# Patient Record
Sex: Male | Born: 1995 | Race: White | Hispanic: No | Marital: Single | Smoking: Current every day smoker
Health system: Southern US, Community
[De-identification: ages and names within clinical notes are randomized; demographics above are authoritative.]

## PROBLEM LIST (undated history)

## (undated) DIAGNOSIS — J45909 Unspecified asthma, uncomplicated: Secondary | ICD-10-CM

---
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
## 2015-07-05 ENCOUNTER — Emergency Department
Admission: EM | Admit: 2015-07-05 | Discharge: 2015-07-05 | Disposition: A | Discharge status: Home / Self Care | Payer: Medicaid Other | Attending: Emergency Medicine | Admitting: Emergency Medicine

## 2015-07-05 ENCOUNTER — Emergency Department: Payer: Medicaid Other

## 2015-07-05 ENCOUNTER — Encounter: Payer: Self-pay | Admitting: Urgent Care

## 2015-07-05 DIAGNOSIS — J988 Other specified respiratory disorders: Secondary | ICD-10-CM | POA: Diagnosis not present

## 2015-07-05 DIAGNOSIS — F17200 Nicotine dependence, unspecified, uncomplicated: Secondary | ICD-10-CM | POA: Insufficient documentation

## 2015-07-05 DIAGNOSIS — Z720 Tobacco use: Secondary | ICD-10-CM

## 2015-07-05 DIAGNOSIS — J45901 Unspecified asthma with (acute) exacerbation: Secondary | ICD-10-CM | POA: Diagnosis not present

## 2015-07-05 DIAGNOSIS — R05 Cough: Secondary | ICD-10-CM | POA: Diagnosis present

## 2015-07-05 HISTORY — DX: Unspecified asthma, uncomplicated: J45.909

## 2015-07-05 LAB — RAPID INFLUENZA A&B ANTIGENS
Influenza A (ARMC): NOT DETECTED
Influenza B (ARMC): NOT DETECTED

## 2015-07-05 MED ORDER — ALBUTEROL SULFATE (2.5 MG/3ML) 0.083% IN NEBU: 2.5000 mg | INHALATION_SOLUTION | Freq: Four times a day (QID) | RESPIRATORY_TRACT | Status: AC | PRN

## 2015-07-05 MED ORDER — ALBUTEROL SULFATE HFA 108 (90 BASE) MCG/ACT IN AERS: INHALATION_SPRAY | RESPIRATORY_TRACT | Status: AC

## 2015-07-05 MED ORDER — PREDNISONE 20 MG PO TABS: 60.0000 mg | ORAL_TABLET | Freq: Every day | ORAL | Status: DC

## 2015-07-05 NOTE — ED Notes (Signed)
Patient presents with c/o cough for over a month. Patient reports that he got better following a course of ABX and steroids, however symptoms returned. (+) pain with deep inspiration. (+) post tussive vomiting.

## 2015-07-05 NOTE — Discharge Instructions (Signed)
We believe that your symptoms are caused today by an exacerbation of your asthma.  Please take the prescribed medications and any medications that you have at home.  Follow up with your doctor as recommended.  If you develop any new or worsening symptoms, including but not limited to fever, persistent vomiting, worsening shortness of breath, or other symptoms that concern you, please return to the Emergency Department immediately.  It is also important that you stop smoking; tobacco is only going to worsen your symptoms and cause more chronic lung disease.    Asthma Asthma is a recurring condition in which the airways tighten and narrow. Asthma can make it difficult to breathe. It can cause coughing, wheezing, and shortness of breath. Asthma episodes, also called asthma attacks, range from minor to life-threatening. Asthma cannot be cured, but medicines and lifestyle changes can help control it. CAUSES Asthma is believed to be caused by inherited (genetic) and environmental factors, but its exact cause is unknown. Asthma may be triggered by allergens, lung infections, or irritants in the air. Asthma triggers are different for each person. Common triggers include:  1. Animal dander. 2. Dust mites. 3. Cockroaches. 4. Pollen from trees or grass. 5. Mold. 6. Smoke. 7. Air pollutants such as dust, household cleaners, hair sprays, aerosol sprays, paint fumes, strong chemicals, or strong odors. 8. Cold air, weather changes, and winds (which increase molds and pollens in the air). 9. Strong emotional expressions such as crying or laughing hard. 10. Stress. 11. Certain medicines (such as aspirin) or types of drugs (such as beta-blockers). 12. Sulfites in foods and drinks. Foods and drinks that may contain sulfites include dried fruit, potato chips, and sparkling grape juice. 13. Infections or inflammatory conditions such as the flu, a cold, or an inflammation of the nasal membranes  (rhinitis). 14. Gastroesophageal reflux disease (GERD). 15. Exercise or strenuous activity. SYMPTOMS Symptoms may occur immediately after asthma is triggered or many hours later. Symptoms include: 1. Wheezing. 2. Excessive nighttime or early morning coughing. 3. Frequent or severe coughing with a common cold. 4. Chest tightness. 5. Shortness of breath. DIAGNOSIS  The diagnosis of asthma is made by a review of your medical history and a physical exam. Tests may also be performed. These may include:  Lung function studies. These tests show how much air you breathe in and out.  Allergy tests.  Imaging tests such as X-rays. TREATMENT  Asthma cannot be cured, but it can usually be controlled. Treatment involves identifying and avoiding your asthma triggers. It also involves medicines. There are 2 classes of medicine used for asthma treatment:   Controller medicines. These prevent asthma symptoms from occurring. They are usually taken every day.  Reliever or rescue medicines. These quickly relieve asthma symptoms. They are used as needed and provide short-term relief. Your health care provider will help you create an asthma action plan. An asthma action plan is a written plan for managing and treating your asthma attacks. It includes a list of your asthma triggers and how they may be avoided. It also includes information on when medicines should be taken and when their dosage should be changed. An action plan may also involve the use of a device called a peak flow meter. A peak flow meter measures how well the lungs are working. It helps you monitor your condition. HOME CARE INSTRUCTIONS   Take medicines only as directed by your health care provider. Speak with your health care provider if you have questions about how or  when to take the medicines.  Use a peak flow meter as directed by your health care provider. Record and keep track of readings.  Understand and use the action plan to help  minimize or stop an asthma attack without needing to seek medical care.  Control your home environment in the following ways to help prevent asthma attacks:  Do not smoke. Avoid being exposed to secondhand smoke.  Change your heating and air conditioning filter regularly.  Limit your use of fireplaces and wood stoves.  Get rid of pests (such as roaches and mice) and their droppings.  Throw away plants if you see mold on them.  Clean your floors and dust regularly. Use unscented cleaning products.  Try to have someone else vacuum for you regularly. Stay out of rooms while they are being vacuumed and for a short while afterward. If you vacuum, use a dust mask from a hardware store, a double-layered or microfilter vacuum cleaner bag, or a vacuum cleaner with a HEPA filter.  Replace carpet with wood, tile, or vinyl flooring. Carpet can trap dander and dust.  Use allergy-proof pillows, mattress covers, and box spring covers.  Wash bed sheets and blankets every week in hot water and dry them in a dryer.  Use blankets that are made of polyester or cotton.  Clean bathrooms and kitchens with bleach. If possible, have someone repaint the walls in these rooms with mold-resistant paint. Keep out of the rooms that are being cleaned and painted.  Wash hands frequently. SEEK MEDICAL CARE IF:   You have wheezing, shortness of breath, or a cough even if taking medicine to prevent attacks.  The colored mucus you cough up (sputum) is thicker than usual.  Your sputum changes from clear or white to yellow, green, gray, or bloody.  You have any problems that may be related to the medicines you are taking (such as a rash, itching, swelling, or trouble breathing).  You are using a reliever medicine more than 2-3 times per week.  Your peak flow is still at 50-79% of your personal best after following your action plan for 1 hour.  You have a fever. SEEK IMMEDIATE MEDICAL CARE IF:   You seem to be  getting worse and are unresponsive to treatment during an asthma attack.  You are short of breath even at rest.  You get short of breath when doing very little physical activity.  You have difficulty eating, drinking, or talking due to asthma symptoms.  You develop chest pain.  You develop a fast heartbeat.  You have a bluish color to your lips or fingernails.  You are light-headed, dizzy, or faint.  Your peak flow is less than 50% of your personal best. MAKE SURE YOU:   Understand these instructions.  Will watch your condition.  Will get help right away if you are not doing well or get worse. Document Released: 06/02/2005 Document Revised: 10/17/2013 Document Reviewed: 12/30/2012 [facility] Patient Information 2015 [place], [place]. This information is not intended to replace advice given to you by your health care provider. Make sure you discuss any questions you have with your health care provider.  How to Use a Nebulizer If you have asthma or other breathing problems, you might need to breathe in (inhale) medicine. This can be done with a nebulizer. A nebulizer is a device that turns liquid medicine into a mist that you can inhale.  There are different kinds of nebulizers. Most are small. With some, you breathe in through  a mouthpiece. With others, a mask fits over your nose and mouth. Most nebulizers must be connected to a small air compressor. Air is forced through tubing from the compressor to the nebulizer. The forced air changes the liquid into a fine spray. RISKS AND COMPLICATIONS The nebulizer must work properly for it to help your breathing. If the nebulizer does not produce mist, or if foam comes out, this indicates that the nebulizer is not working properly. Sometimes a filter can get clogged, or there might be a problem with the air compressor. Check the instruction booklet that came with your nebulizer. It should tell you how to fix problems or where to call for help. You  should have at least one extra nebulizer at home. That way, you will always have one when you need it.  HOW TO PREPARE BEFORE USING THE NEBULIZER Take these steps before using the nebulizer: 16. Check your medicine. Make sure it has not expired and is not damaged in any way.  17. Wash your hands with soap and water.  18. Put all the parts of your nebulizer on a sturdy, flat surface. Make sure the tubing connects the compressor and the nebulizer. 19. Measure the liquid medicine according to your health care provider's instructions. Pour it into the nebulizer. 20. Attach the mouthpiece or mask.  21. Test the nebulizer by turning it on to make sure a spray is coming out. Then, turn it off.  HOW TO USE THE NEBULIZER 6. Sit down and focus on staying relaxed.  7. If your nebulizer has a mask, put it over your nose and mouth. If you use a mouthpiece, put it in your mouth. Press your lips firmly around the mouthpiece. 8. Turn on the nebulizer.  9. Breathe out.  10. Some nebulizers have a finger valve. If yours does, cover up the air hole so the air gets to the nebulizer. 11. Once the medicine begins to mist out, take slow, deep breaths. If there is a finger valve, release it at the end of your breath. 12. Continue taking slow, deep breaths until the nebulizer is empty.  Be sure to stop the machine at any point if you start coughing or if the medicine foams or bubbles. HOW TO CLEAN THE NEBULIZER  The nebulizer and all its parts must be kept very clean. Follow the manufacturer's instructions for cleaning. For most nebulizers, you should follow these guidelines:  Wash the nebulizer after each use. Use warm water and soap. Rinse it well. Shake the nebulizer to remove extra water. Put it on a clean towel until it is completely dry. To make sure it is dry, put the nebulizer back together. Turn on the compressor for a few minutes. This will blow air through the nebulizer.   Do not wash the tubing or  the finger valve.   Store the nebulizer in a dust-free place.   Inspect the filter every week. Replace it any time it looks dirty.   Sometimes the nebulizer will need a more complete cleaning. The instruction booklet should say how often you need to do this. SEEK MEDICAL CARE IF:   You continue to have difficulty breathing.   You have trouble using the nebulizer.  Document Released: 05/21/2009 Document Revised: 10/17/2013 Document Reviewed: 11/22/2012 [facility] Patient Information 2015 [place], [place]. This information is not intended to replace advice given to you by your health care provider. Make sure you discuss any questions you have with your health care provider.  How to  Use an Inhaler Proper inhaler technique is very important. Good technique ensures that the medicine reaches the lungs. Poor technique results in depositing the medicine on the tongue and back of the throat rather than in the airways. If you do not use the inhaler with good technique, the medicine will not help you. STEPS TO FOLLOW IF USING AN INHALER WITHOUT AN EXTENSION TUBE 22. Remove the cap from the inhaler. 23. If you are using the inhaler for the first time, you will need to prime it. Shake the inhaler for 5 seconds and release four puffs into the air, away from your face. Ask your health care provider or pharmacist if you have questions about priming your inhaler. 24. Shake the inhaler for 5 seconds before each breath in (inhalation). 25. Position the inhaler so that the top of the canister faces up. 26. Put your index finger on the top of the medicine canister. Your thumb supports the bottom of the inhaler. 27. Open your mouth. 28. Either place the inhaler between your teeth and place your lips tightly around the mouthpiece, or hold the inhaler 1-2 inches away from your open mouth. If you are unsure of which technique to use, ask your health care provider. 29. Breathe out (exhale) normally and as  completely as possible. 30. Press the canister down with your index finger to release the medicine. 31. At the same time as the canister is pressed, inhale deeply and slowly until your lungs are completely filled. This should take 4-6 seconds. Keep your tongue down. 32. Hold the medicine in your lungs for 5-10 seconds (10 seconds is best). This helps the medicine get into the small airways of your lungs. 33. Breathe out slowly, through pursed lips. Whistling is an example of pursed lips. 34. Wait at least 15-30 seconds between puffs. Continue with the above steps until you have taken the number of puffs your health care provider has ordered. Do not use the inhaler more than your health care provider tells you. 35. Replace the cap on the inhaler. 36. Follow the directions from your health care provider or the inhaler insert for cleaning the inhaler. STEPS TO FOLLOW IF USING AN INHALER WITH AN EXTENSION (SPACER) 13. Remove the cap from the inhaler. 14. If you are using the inhaler for the first time, you will need to prime it. Shake the inhaler for 5 seconds and release four puffs into the air, away from your face. Ask your health care provider or pharmacist if you have questions about priming your inhaler. 15. Shake the inhaler for 5 seconds before each breath in (inhalation). 16. Place the open end of the spacer onto the mouthpiece of the inhaler. 17. Position the inhaler so that the top of the canister faces up and the spacer mouthpiece faces you. 18. Put your index finger on the top of the medicine canister. Your thumb supports the bottom of the inhaler and the spacer. 19. Breathe out (exhale) normally and as completely as possible. 20. Immediately after exhaling, place the spacer between your teeth and into your mouth. Close your lips tightly around the spacer. 21. Press the canister down with your index finger to release the medicine. 22. At the same time as the canister is pressed, inhale  deeply and slowly until your lungs are completely filled. This should take 4-6 seconds. Keep your tongue down and out of the way. 23. Hold the medicine in your lungs for 5-10 seconds (10 seconds is best). This helps the medicine  get into the small airways of your lungs. Exhale. 24. Repeat inhaling deeply through the spacer mouthpiece. Again hold that breath for up to 10 seconds (10 seconds is best). Exhale slowly. If it is difficult to take this second deep breath through the spacer, breathe normally several times through the spacer. Remove the spacer from your mouth. 25. Wait at least 15-30 seconds between puffs. Continue with the above steps until you have taken the number of puffs your health care provider has ordered. Do not use the inhaler more than your health care provider tells you. 26. Remove the spacer from the inhaler, and place the cap on the inhaler. 27. Follow the directions from your health care provider or the inhaler insert for cleaning the inhaler and spacer. If you are using different kinds of inhalers, use your quick relief medicine to open the airways 10-15 minutes before using a steroid if instructed to do so by your health care provider. If you are unsure which inhalers to use and the order of using them, ask your health care provider, nurse, or respiratory therapist. If you are using a steroid inhaler, always rinse your mouth with water after your last puff, then gargle and spit out the water. Do not swallow the water. AVOID:  Inhaling before or after starting the spray of medicine. It takes practice to coordinate your breathing with triggering the spray.  Inhaling through the nose (rather than the mouth) when triggering the spray. HOW TO DETERMINE IF YOUR INHALER IS FULL OR NEARLY EMPTY You cannot know when an inhaler is empty by shaking it. A few inhalers are now being made with dose counters. Ask your health care provider for a prescription that has a dose counter if you feel  you need that extra help. If your inhaler does not have a counter, ask your health care provider to help you determine the date you need to refill your inhaler. Write the refill date on a calendar or your inhaler canister. Refill your inhaler 7-10 days before it runs out. Be sure to keep an adequate supply of medicine. This includes making sure it is not expired, and that you have a spare inhaler.  SEEK MEDICAL CARE IF:   Your symptoms are only partially relieved with your inhaler.  You are having trouble using your inhaler.  You have some increase in phlegm. SEEK IMMEDIATE MEDICAL CARE IF:   You feel little or no relief with your inhalers. You are still wheezing and are feeling shortness of breath or tightness in your chest or both.  You have dizziness, headaches, or a fast heart rate.  You have chills, fever, or night sweats.  You have a noticeable increase in phlegm production, or there is blood in the phlegm. MAKE SURE YOU:   Understand these instructions.  Will watch your condition.  Will get help right away if you are not doing well or get worse. Document Released: 05/30/2000 Document Revised: 03/23/2013 Document Reviewed: 12/30/2012 [facility] Patient Information 2015 [place], [place]. This information is not intended to replace advice given to you by your health care provider. Make sure you discuss any questions you have with your health care provider.

## 2015-07-05 NOTE — ED Provider Notes (Signed)
[facility] Emergency Department Provider Note  ____________________________________________  Time seen: Approximately 5:49 AM  I have reviewed the triage vital signs and the nursing notes.   HISTORY  Chief Complaint Cough    HPI Frederick Griffith is a 20 y.o. male with a past medical history that includes asthma and tobacco use who presents with gradually worsening shortness of breath and cough for about a month.  He states that it has gotten worse over the last few days.  Exertion makes it worse.  Using albuterol through a nebulizer seems to make it better.  He denies fever/chills, chest pain, abdominal pain, diarrhea.  He has some vomiting after coughing at times.  He has noted some wheezing.     Past Medical History  Diagnosis Date  . Asthma     There are no active problems to display for this patient.   History reviewed. No pertinent past surgical history.  Current Outpatient Rx  Name  Route  Sig  Dispense  Refill  . albuterol (PROVENTIL HFA;VENTOLIN HFA) 108 (90 Base) MCG/ACT inhaler      Inhale 4-6 puffs by mouth every 4 hours as needed for wheezing, cough, and/or shortness of breath   1 Inhaler   1   . albuterol (PROVENTIL) (2.5 MG/3ML) 0.083% nebulizer solution   Nebulization   Take 3 mLs (2.5 mg total) by nebulization every 6 (six) hours as needed for wheezing or shortness of breath.   75 mL   1   . predniSONE (DELTASONE) 20 MG tablet   Oral   Take 3 tablets (60 mg total) by mouth daily.   15 tablet   0     Allergies Codeine; Doxycycline; and Shellfish allergy  No family history on file.  Social History Social History  Substance Use Topics  . Smoking status: Current Every Day Smoker  . Smokeless tobacco: None  . Alcohol Use: No    Review of Systems Constitutional: No fever/chills Eyes: No visual changes. ENT: No sore throat. Cardiovascular: Denies chest pain. Respiratory: Wheezing and SOB, worsening for about a week  but present for about a month Gastrointestinal: No abdominal pain.  Occasional N/V, particularly after coughing spells.  No diarrhea.  No constipation. Genitourinary: Negative for dysuria. Musculoskeletal: Negative for back pain. Skin: Negative for rash. Neurological: Negative for headaches, focal weakness or numbness.  10-point ROS otherwise negative.  ____________________________________________   PHYSICAL EXAM:  VITAL SIGNS: ED Triage Vitals  Enc Vitals Group     BP 07/05/15 0130 148/89 mmHg     Pulse Rate 07/05/15 0130 107     Resp 07/05/15 0130 24     Temp 07/05/15 0130 98.5 F (36.9 C)     Temp Source 07/05/15 0130 Oral     SpO2 07/05/15 0130 99 %     Weight 07/05/15 0130 210 lb (95.255 kg)     Height 07/05/15 0130  (1.676 m)     Head Cir --      Peak Flow --      Pain Score 07/05/15 0131 9     Pain Loc --      Pain Edu? --      Excl. in GC? --     Constitutional: Alert and oriented. Well appearing and in no acute distress. Eyes: Conjunctivae are normal. PERRL. EOMI. Head: Atraumatic. Nose: No congestion/rhinnorhea. Mouth/Throat: Mucous membranes are moist.  Oropharynx non-erythematous. Neck: No stridor.   Cardiovascular: Normal rate, regular rhythm. Grossly normal heart sounds.  Good peripheral circulation. Respiratory: Normal respiratory effort.  No retractions. Expiratory wheezing throughout (mild) Gastrointestinal: Soft and nontender. No distention. No abdominal bruits. No CVA tenderness. Musculoskeletal: No lower extremity tenderness nor edema.  No joint effusions. Neurologic:  Normal speech and language. No gross focal neurologic deficits are appreciated.  Skin:  Skin is warm, dry and intact. No rash noted. Psychiatric: Mood and affect are normal. Speech and behavior are normal.  ____________________________________________   LABS (all labs ordered are listed, but only abnormal results are displayed)  Labs Reviewed  RAPID INFLUENZA A&B ANTIGENS  [facility] ONLY)   ____________________________________________  EKG  None ____________________________________________  RADIOLOGY   Dg Chest 2 View  07/05/2015  CLINICAL DATA:  Chronic cough. Generalized chest pain with deep inspiration. Vomiting. Initial encounter. EXAM: CHEST  2 VIEW COMPARISON:  None. FINDINGS: The lungs are well-aerated and clear. There is no evidence of focal opacification, pleural effusion or pneumothorax. The heart is normal in size; the mediastinal contour is within normal limits. No acute osseous abnormalities are seen. IMPRESSION: No acute cardiopulmonary process seen. Electronically Signed   By: Roanna Raider M.D.   On: 07/05/2015 02:09    ____________________________________________   PROCEDURES  Procedure(s) performed: None  Critical Care performed: No ____________________________________________   INITIAL IMPRESSION / ASSESSMENT AND PLAN / ED COURSE  Pertinent labs & imaging results that were available during my care of the patient were reviewed by me and considered in my medical decision making (see chart for details).  Asthma exacerbation +/- viral infection.  No evidence of PNA.  Will treat as asthma exacerbation.    I gave my usual and customary return precautions.   Patient agrees with plan.  ____________________________________________  FINAL CLINICAL IMPRESSION(S) / ED DIAGNOSES  Final diagnoses:  Asthma exacerbation  Wheezing-associated respiratory infection  Tobacco use      NEW MEDICATIONS STARTED DURING THIS VISIT:  New Prescriptions   ALBUTEROL (PROVENTIL HFA;VENTOLIN HFA) 108 (90 BASE) MCG/ACT INHALER    Inhale 4-6 puffs by mouth every 4 hours as needed for wheezing, cough, and/or shortness of breath   ALBUTEROL (PROVENTIL) (2.5 MG/3ML) 0.083% NEBULIZER SOLUTION    Take 3 mLs (2.5 mg total) by nebulization every 6 (six) hours as needed for wheezing or shortness of breath.   PREDNISONE (DELTASONE) 20 MG TABLET    Take 3  tablets (60 mg total) by mouth daily.     Loleta Rose, MD 07/05/15 [phone]

## 2015-07-05 NOTE — ED Notes (Signed)
Spoke with Manson Passey, MD regarding presenting c/o and triage assessment. MD with [facility] for 2 view CXR and rapid flu only at this time. Orders to be entered and carried by this RN.

## 2015-07-31 ENCOUNTER — Ambulatory Visit
Admission: EM | Admit: 2015-07-31 | Discharge: 2015-07-31 | Disposition: A | Discharge status: Home / Self Care | Payer: Medicaid Other | Attending: Family Medicine | Admitting: Family Medicine

## 2015-07-31 ENCOUNTER — Encounter: Payer: Self-pay | Admitting: Emergency Medicine

## 2015-07-31 DIAGNOSIS — T7840XA Allergy, unspecified, initial encounter: Secondary | ICD-10-CM

## 2015-07-31 MED ORDER — DIPHENHYDRAMINE HCL 50 MG/ML IJ SOLN
50.0000 mg | Freq: Once | INTRAMUSCULAR | Status: AC
  Administered 2015-07-31: 50 mg via INTRAMUSCULAR

## 2015-07-31 MED ORDER — PREDNISONE 50 MG PO TABS
60.0000 mg | ORAL_TABLET | Freq: Once | ORAL | Status: AC
  Administered 2015-07-31: 60 mg via ORAL

## 2015-07-31 MED ORDER — EPINEPHRINE 0.3 MG/0.3ML IJ SOAJ: 0.3000 mg | Freq: Once | INTRAMUSCULAR | Status: AC

## 2015-07-31 MED ORDER — PREDNISONE 20 MG PO TABS: 20.0000 mg | ORAL_TABLET | Freq: Every day | ORAL | Status: DC

## 2015-07-31 NOTE — ED Notes (Signed)
Patient states that he works at a [employer] and was shelling shrimp yesterday.  Patient c/o itching, rash and swelling in his hands that started around 9:30pm last night.  Patient took Benadryl at 10:30pm last night.  Patient states that he has an allergy to Shellfish.

## 2015-07-31 NOTE — ED Provider Notes (Signed)
CSN: [id number]     Arrival date & time 07/31/15  1251 History   First MD Initiated Contact with Patient 07/31/15 1402     Chief Complaint  Patient presents with  . Allergic Reaction   (Consider location/radiation/quality/duration/timing/severity/associated sxs/prior Treatment) HPI Comments: 20 yo male with a c/o itchy rash, allergic reaction to shrimp since last night. 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 Last night developed rash, swelling and itching of his hands. Last night took Benadryl at around 10:30pm. Denies any wheezing, chest pains, shortness of breath, or facial swelling.   The history is provided by the patient.    Past Medical History  Diagnosis Date  . Asthma    History reviewed. No pertinent past surgical history. History reviewed. No pertinent family history. Social History  Substance Use Topics  . Smoking status: Current Every Day Smoker  . Smokeless tobacco: None  . Alcohol Use: No    Review of Systems  Allergies  Codeine; Doxycycline; and Shellfish allergy  Home Medications   Prior to Admission medications   Medication Sig Start Date End Date Taking? Authorizing Provider  atomoxetine (STRATTERA) 25 MG capsule Take 25 mg by mouth daily.   Yes Historical Provider, MD  omeprazole (PRILOSEC) 20 MG capsule Take 20 mg by mouth as needed.   Yes Historical Provider, MD  albuterol (PROVENTIL HFA;VENTOLIN HFA) 108 (90 Base) MCG/ACT inhaler Inhale 4-6 puffs by mouth every 4 hours as needed for wheezing, cough, and/or shortness of breath 07/05/15   Loleta Rose, MD  albuterol (PROVENTIL) (2.5 MG/3ML) 0.083% nebulizer solution Take 3 mLs (2.5 mg total) by nebulization every 6 (six) hours as needed for wheezing or shortness of breath. 07/05/15   Loleta Rose, MD  EPINEPHrine 0.3 mg/0.3 mL IJ SOAJ injection Inject 0.3 mLs (0.3 mg total) into the muscle once. prn 07/31/15   Payton Mccallum, MD  predniSONE (DELTASONE) 20 MG tablet Take 1 tablet (20 mg total) by mouth daily. 07/31/15   Payton Mccallum, MD   Meds Ordered and Administered this Visit   Medications  diphenhydrAMINE (BENADRYL) injection 50 mg (50 mg Intramuscular Given 07/31/15 1323)  predniSONE (DELTASONE) tablet 60 mg (60 mg Oral Given 07/31/15 1416)    BP 129/70 mmHg  Pulse 92  Temp(Src) 98.6 F (37 C) (Tympanic)  Resp 17  Ht  (1.676 m)  Wt 210 lb (95.255 kg)  BMI 33.91 kg/m2  SpO2 100% No data found.   Physical Exam  Constitutional: He appears well-developed and well-nourished. No distress.  HENT:  Head: Normocephalic and atraumatic.  Right Ear: Tympanic membrane, external ear and ear canal normal.  Left Ear: Tympanic membrane, external ear and ear canal normal.  Nose: Nose normal.  Mouth/Throat: Uvula is midline, oropharynx is clear and moist and mucous membranes are normal. No oropharyngeal exudate, posterior oropharyngeal edema, posterior oropharyngeal erythema or tonsillar abscesses.  Eyes: Conjunctivae and EOM are normal. Pupils are equal, round, and reactive to light. Right eye exhibits no discharge. Left eye exhibits no discharge. No scleral icterus.  Neck: Normal range of motion. Neck supple. No tracheal deviation present. No thyromegaly present.  Cardiovascular: Normal rate, regular rhythm and normal heart sounds.   Pulmonary/Chest: Effort normal and breath sounds normal. No stridor. No respiratory distress. He has no wheezes. He has no rales. He exhibits no tenderness.  Lymphadenopathy:    He has no cervical adenopathy.  Neurological: He is alert.  Skin:  Skin is warm and dry. Rash noted. Rash is urticarial (over hands and arms). He is not diaphoretic.  Nursing note and vitals reviewed.   ED Course  Procedures (including critical care time)  Labs Review Labs Reviewed - No data to display  Imaging Review No results found.   Visual Acuity Review  Right Eye Distance:    Left Eye Distance:   Bilateral Distance:    Right Eye Near:   Left Eye Near:    Bilateral Near:         MDM   1. Allergic reaction, initial encounter    Discharge Medication List as of 07/31/2015  2:18 PM     Discharge Medication List as of 07/31/2015  2:18 PM     1. diagnosis reviewed with patient 2. Patient given Benadryl  po x 1 (with resolution of symptoms) and prednisone  po x 1 3. rx as per orders above; reviewed possible side effects, interactions, risks and benefits; patient given rx for prednisone x3 days and Epi-Pen 3. Recommend supportive treatment with benadryl prn 4. Follow-up prn if symptoms worsen or don't improve    Payton Mccallum, MD 07/31/15 1606

## 2015-10-11 ENCOUNTER — Ambulatory Visit
Admission: EM | Admit: 2015-10-11 | Discharge: 2015-10-11 | Disposition: A | Discharge status: Home / Self Care | Payer: Medicaid Other | Attending: Family Medicine | Admitting: Family Medicine

## 2015-10-11 ENCOUNTER — Ambulatory Visit: Payer: Medicaid Other

## 2015-10-11 ENCOUNTER — Encounter: Payer: Self-pay | Admitting: *Deleted

## 2015-10-11 DIAGNOSIS — S40012A Contusion of left shoulder, initial encounter: Secondary | ICD-10-CM | POA: Diagnosis not present

## 2015-10-11 DIAGNOSIS — Y93I9 Activity, other involving external motion: Secondary | ICD-10-CM | POA: Diagnosis not present

## 2015-10-11 DIAGNOSIS — W1789XA Other fall from one level to another, initial encounter: Secondary | ICD-10-CM | POA: Insufficient documentation

## 2015-10-11 DIAGNOSIS — M25512 Pain in left shoulder: Secondary | ICD-10-CM | POA: Diagnosis present

## 2015-10-11 MED ORDER — TIZANIDINE HCL 4 MG PO TABS: 4.0000 mg | ORAL_TABLET | Freq: Four times a day (QID) | ORAL | Status: DC | PRN

## 2015-10-11 MED ORDER — MELOXICAM 15 MG PO TABS: 15.0000 mg | ORAL_TABLET | Freq: Every day | ORAL | Status: DC

## 2015-10-11 MED ORDER — KETOROLAC TROMETHAMINE 60 MG/2ML IM SOLN
60.0000 mg | Freq: Once | INTRAMUSCULAR | Status: AC
  Administered 2015-10-11: 60 mg via INTRAMUSCULAR

## 2015-10-11 NOTE — ED Provider Notes (Signed)
CSN: [id number]     Arrival date & time 10/11/15  1156 History   First MD Initiated Contact with Patient 10/11/15 1252   Nurses notes were reviewed. Chief Complaint  Patient presents with  . Shoulder Injury    Patient was using a child scooter or other device when he slipped off scooter last night leading his left shoulder. Reports hearing a pop noise and pain in the left shoulder. Left shoulder was so painful he had difficulty sleeping at night and he succumbed to pain medication which was later questioned ibuprofen is fine able to go to sleep. He reports increased pain in left shoulder and difficulty moving the left shoulder as well. Also feels that is deformity of the shoulder when he looks at it. Smoke. No significant family medical problems pertinent to today's visit and he has no pertinent medical chronic problems other than asthma.  (Consider location/radiation/quality/duration/timing/severity/associated sxs/prior Treatment) Patient is a 20 y.o. male presenting with shoulder injury. The history is provided by the patient and the spouse. No language interpreter was used.  Shoulder Injury This is a new problem. The current episode started yesterday. The problem occurs constantly. The problem has been gradually worsening. Pertinent negatives include no chest pain, no abdominal pain, no headaches and no shortness of breath. Nothing aggravates the symptoms. Treatments tried: antiinflamatory. The treatment provided no relief.    Past Medical History  Diagnosis Date  . Asthma    History reviewed. No pertinent past surgical history. History reviewed. No pertinent family history. Social History  Substance Use Topics  . Smoking status: Current Every Day Smoker  . Smokeless tobacco: None  . Alcohol Use: No    Review of Systems  Respiratory: Negative for shortness of breath.   Cardiovascular: Negative for chest pain.  Gastrointestinal: Negative for abdominal pain.  Neurological: Negative  for headaches.  All other systems reviewed and are negative.   Allergies  Codeine; Doxycycline; and Shellfish allergy  Home Medications   Prior to Admission medications   Medication Sig Start Date End Date Taking? Authorizing Provider  albuterol (PROVENTIL HFA;VENTOLIN HFA) 108 (90 Base) MCG/ACT inhaler Inhale 4-6 puffs by mouth every 4 hours as needed for wheezing, cough, and/or shortness of breath 07/05/15  Yes Loleta Rose, MD  atomoxetine (STRATTERA) 25 MG capsule Take 25 mg by mouth daily.   Yes Historical Provider, MD  omeprazole (PRILOSEC) 20 MG capsule Take 20 mg by mouth as needed.   Yes Historical Provider, MD  albuterol (PROVENTIL) (2.5 MG/3ML) 0.083% nebulizer solution Take 3 mLs (2.5 mg total) by nebulization every 6 (six) hours as needed for wheezing or shortness of breath. 07/05/15   Loleta Rose, MD  EPINEPHrine 0.3 mg/0.3 mL IJ SOAJ injection Inject 0.3 mLs (0.3 mg total) into the muscle once. prn 07/31/15   Payton Mccallum, MD  meloxicam (MOBIC) 15 MG tablet Take 1 tablet (15 mg total) by mouth daily. 10/11/15   Hassan Rowan, MD  predniSONE (DELTASONE) 20 MG tablet Take 1 tablet (20 mg total) by mouth daily. 07/31/15   Payton Mccallum, MD  tiZANidine (ZANAFLEX) 4 MG tablet Take 1 tablet (4 mg total) by mouth every 6 (six) hours as needed for muscle spasms. 10/11/15   Hassan Rowan, MD   Meds Ordered and Administered this Visit   Medications  ketorolac (TORADOL) injection 60 mg (60 mg Intramuscular Given 10/11/15 1232)    BP 125/74 mmHg  Pulse 74  Temp(Src) 98.6 F (37 C) (Oral)  Resp 16  Ht  (  1.676 m)  Wt 200 lb (90.719 kg)  BMI 32.30 kg/m2  SpO2 99% No data found.   Physical Exam  Constitutional: He is oriented to person, place, and time. He appears well-developed and well-nourished.  HENT:  Head: Normocephalic and atraumatic.  Eyes: Conjunctivae and EOM are normal.  Neck: Neck supple.  Musculoskeletal: He exhibits tenderness.       Left shoulder: He exhibits  decreased range of motion, tenderness, pain and spasm.  Patient is able to lift the left arm over his head with effort. Initially states that cause pain when he lifted his arm over his head and I encouraged him to keep going. He is also able to resist pressure when he brings arm to 180 thumb down and able to resist me pushing down on the arm indicating low probability of rotator cuff injury. He has tenderness over the left biceps tendon. This swelling over the shoulder but informed left shoulder x-ray was negative.  Neurological: He is alert and oriented to person, place, and time.  Skin: Skin is warm. No rash noted. No erythema.  Psychiatric: He has a normal mood and affect.  Vitals reviewed.   ED Course  Procedures (including critical care time)  Labs Review Labs Reviewed - No data to display  Imaging Review Dg Shoulder Left  10/11/2015  CLINICAL DATA:  Larey SeatFell off scooter last night.  Pain EXAM: LEFT SHOULDER - 2+ VIEW COMPARISON:  None. FINDINGS: There is no evidence of fracture or dislocation. There is no evidence of arthropathy or other focal bone abnormality. Soft tissues are unremarkable. IMPRESSION: Negative. Electronically Signed   By: Marlan Palauharles  Clark M.D.   On: 10/11/2015 13:09     Visual Acuity Review  Right Eye Distance:   Left Eye Distance:   Bilateral Distance:    Right Eye Near:   Left Eye Near:    Bilateral Near:         MDM   1. Shoulder contusion, left, initial encounter    Patient placed on Mobic 15 mg 1 tablet day follow-up with PCP orthopedic of choice. Work note given for today and tomorrow. We'll also provide a sling to help him get some support to the left shoulder. If he needs to imitate listing off to go to work on Saturday. Also recommend ice to the left shoulder and Mobic 15 mg. Recommend he not try to do any more vomiting on the scooter also stop smoking. Discussed with him whether he needs and and narcotic pain medicine offer tramadol she declined  because it can stop his someone's breathing since he feels as strongly about narcotics were not given any narcotic but just have him use the Mobic.    Hassan RowanEugene Taleyah Hillman, MD 10/11/15 1359

## 2015-10-11 NOTE — Discharge Instructions (Signed)
Contusion °A contusion is a deep bruise. Contusions happen when an injury causes bleeding under the skin. Symptoms of bruising include pain, swelling, and discolored skin. The skin may turn blue, purple, or yellow. °HOME CARE  °· Rest the injured area. °· If told, put ice on the injured area. °· Put ice in a plastic bag. °· Place a towel between your skin and the bag. °· Leave the ice on for 20 minutes, 2-3 times per day. °· If told, put light pressure (compression) on the injured area using an elastic bandage. Make sure the bandage is not too tight. Remove it and put it back on as told by your doctor. °· If possible, raise (elevate) the injured area above the level of your heart while you are sitting or lying down. °· Take over-the-counter and prescription medicines only as told by your doctor. °GET HELP IF: °· Your symptoms do not get better after several days of treatment. °· Your symptoms get worse. °· You have trouble moving the injured area. °GET HELP RIGHT AWAY IF:  °· You have very bad pain. °· You have a loss of feeling (numbness) in a hand or foot. °· Your hand or foot turns pale or cold. °  °This information is not intended to replace advice given to you by your health care provider. Make sure you discuss any questions you have with your health care provider. °  °Document Released: 11/19/2007 Document Revised: 02/21/2015 Document Reviewed: 10/18/2014 °Elsevier Interactive Patient Education ©2016 [employer] ° °Cryotherapy °Cryotherapy is when you put ice on your injury. Ice helps lessen pain and puffiness (swelling) after an injury. Ice works the best when you start using it in the first 24 to 48 hours after an injury. °HOME CARE °· Put a dry or damp towel between the ice pack and your skin. °· You may press gently on the ice pack. °· Leave the ice on for no more than 10 to 20 minutes at a time. °· Check your skin after 5 minutes to make sure your skin is okay. °· Rest at least 20 minutes between ice  pack uses. °· Stop using ice when your skin loses feeling (numbness). °· Do not use ice on someone who cannot tell you when it hurts. This includes small children and people with memory problems (dementia). °GET HELP RIGHT AWAY IF: °· You have white spots on your skin. °· Your skin turns blue or pale. °· Your skin feels waxy or hard. °· Your puffiness gets worse. °MAKE SURE YOU:  °· Understand these instructions. °· Will watch your condition. °· Will get help right away if you are not doing well or get worse. °  °This information is not intended to replace advice given to you by your health care provider. Make sure you discuss any questions you have with your health care provider. °  °Document Released: 11/19/2007 Document Revised: 08/25/2011 Document Reviewed: 01/23/2011 °Elsevier Interactive Patient Education ©2016 [employer] ° °

## 2015-10-11 NOTE — ED Notes (Signed)
Larey SeatFell and landed on left side with arm extended upward. Now c/o left shoulder pain. Left shoulder appears deformed and lower than right.

## 2016-04-28 IMAGING — CR DG CHEST 2V
1 series · 2 of 2 positions shown · non-contrast
Comparison: None.

CLINICAL DATA: Chronic cough. Generalized chest pain with deep
inspiration. Vomiting. Initial encounter.

EXAM:
CHEST  2 VIEW

[Series 1: dg chest 2 view · 0.14mm/px · 2 of 2 slices shown]
[im 1/2]
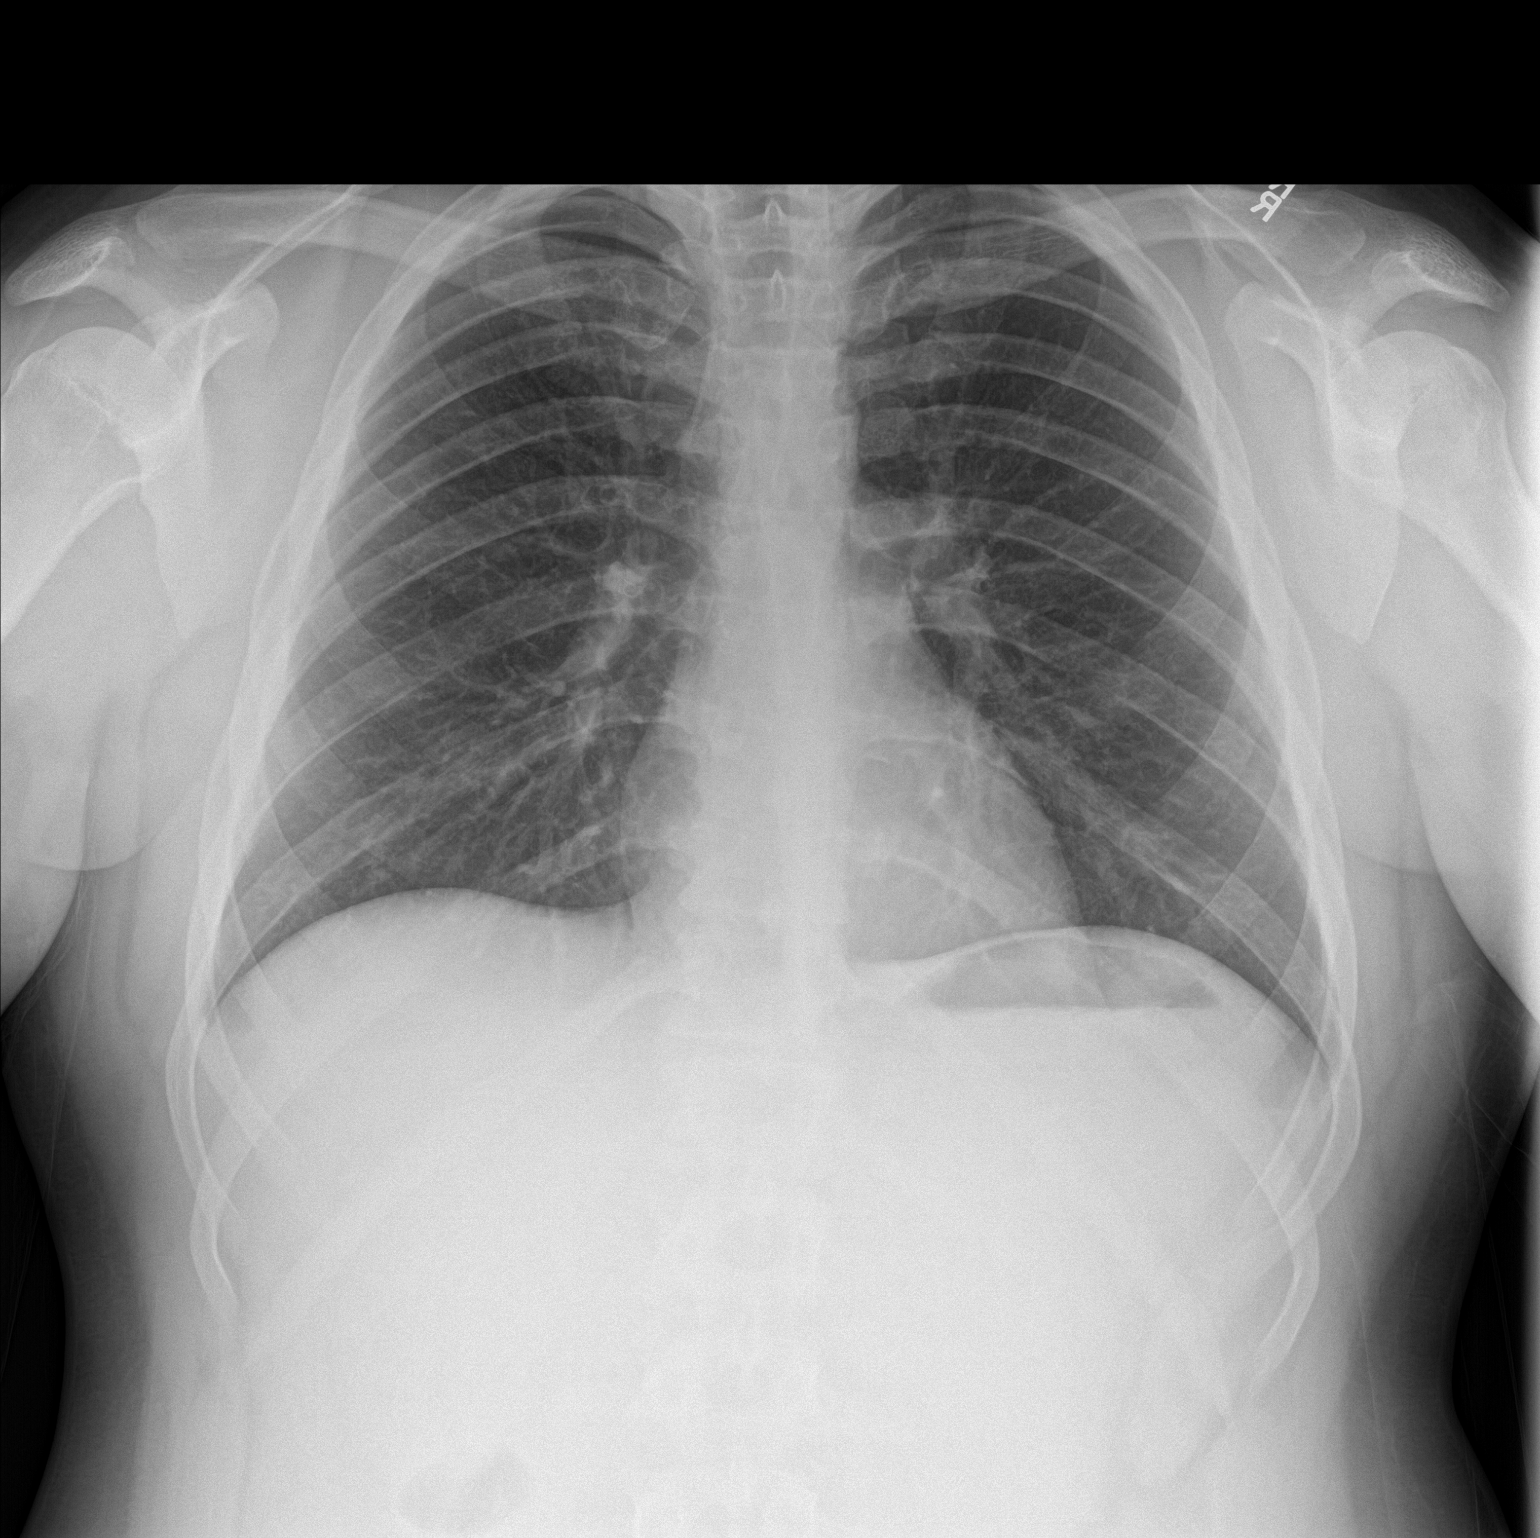
[im 2/2]
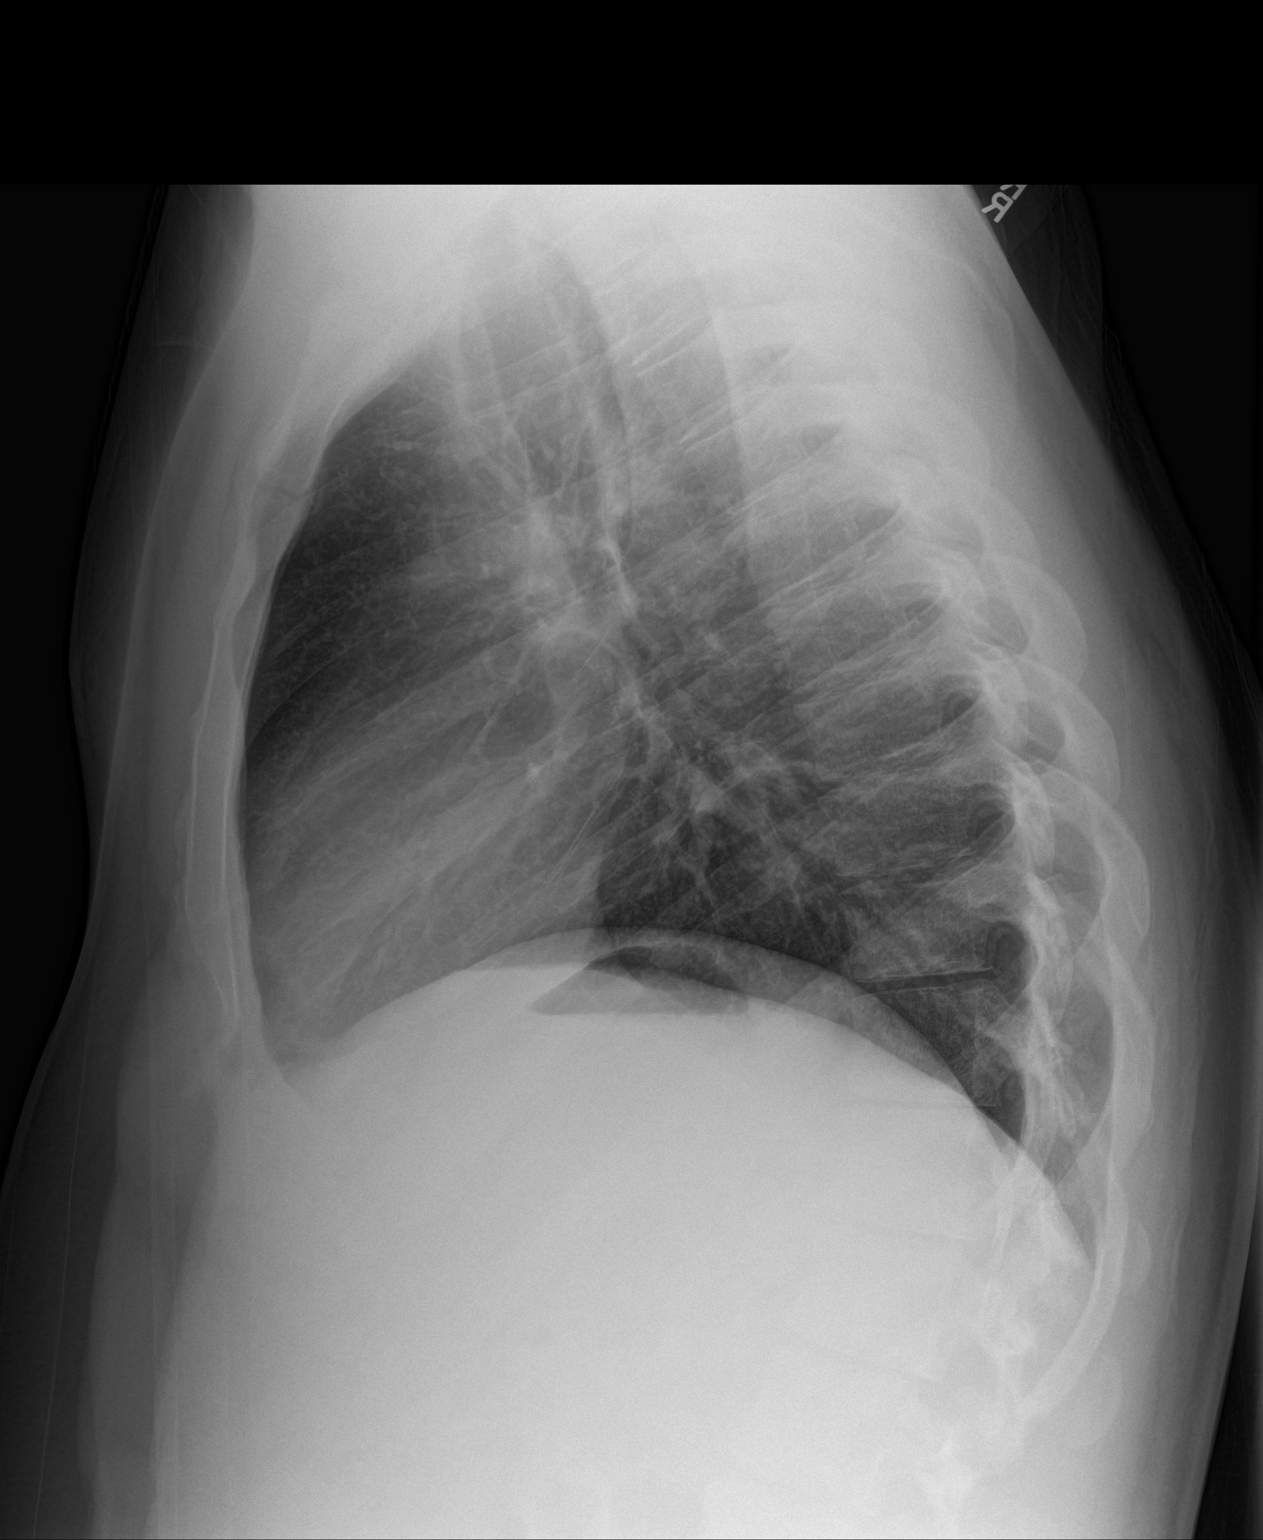

[2 of 2 positions shown; findings below may reference images not displayed]

FINDINGS: The lungs are well-aerated and clear. There is no evidence of focal
opacification, pleural effusion or pneumothorax.

The heart is normal in size; the mediastinal contour is within
normal limits. No acute osseous abnormalities are seen.
IMPRESSION: No acute cardiopulmonary process seen.

## 2016-08-04 IMAGING — CR DG SHOULDER 2+V*L*
3 series · 3 of 3 positions shown · non-contrast
Comparison: None.

CLINICAL DATA: Fell off scooter last night.  Pain

EXAM:
LEFT SHOULDER - 2+ VIEW

[shoulder grashey]
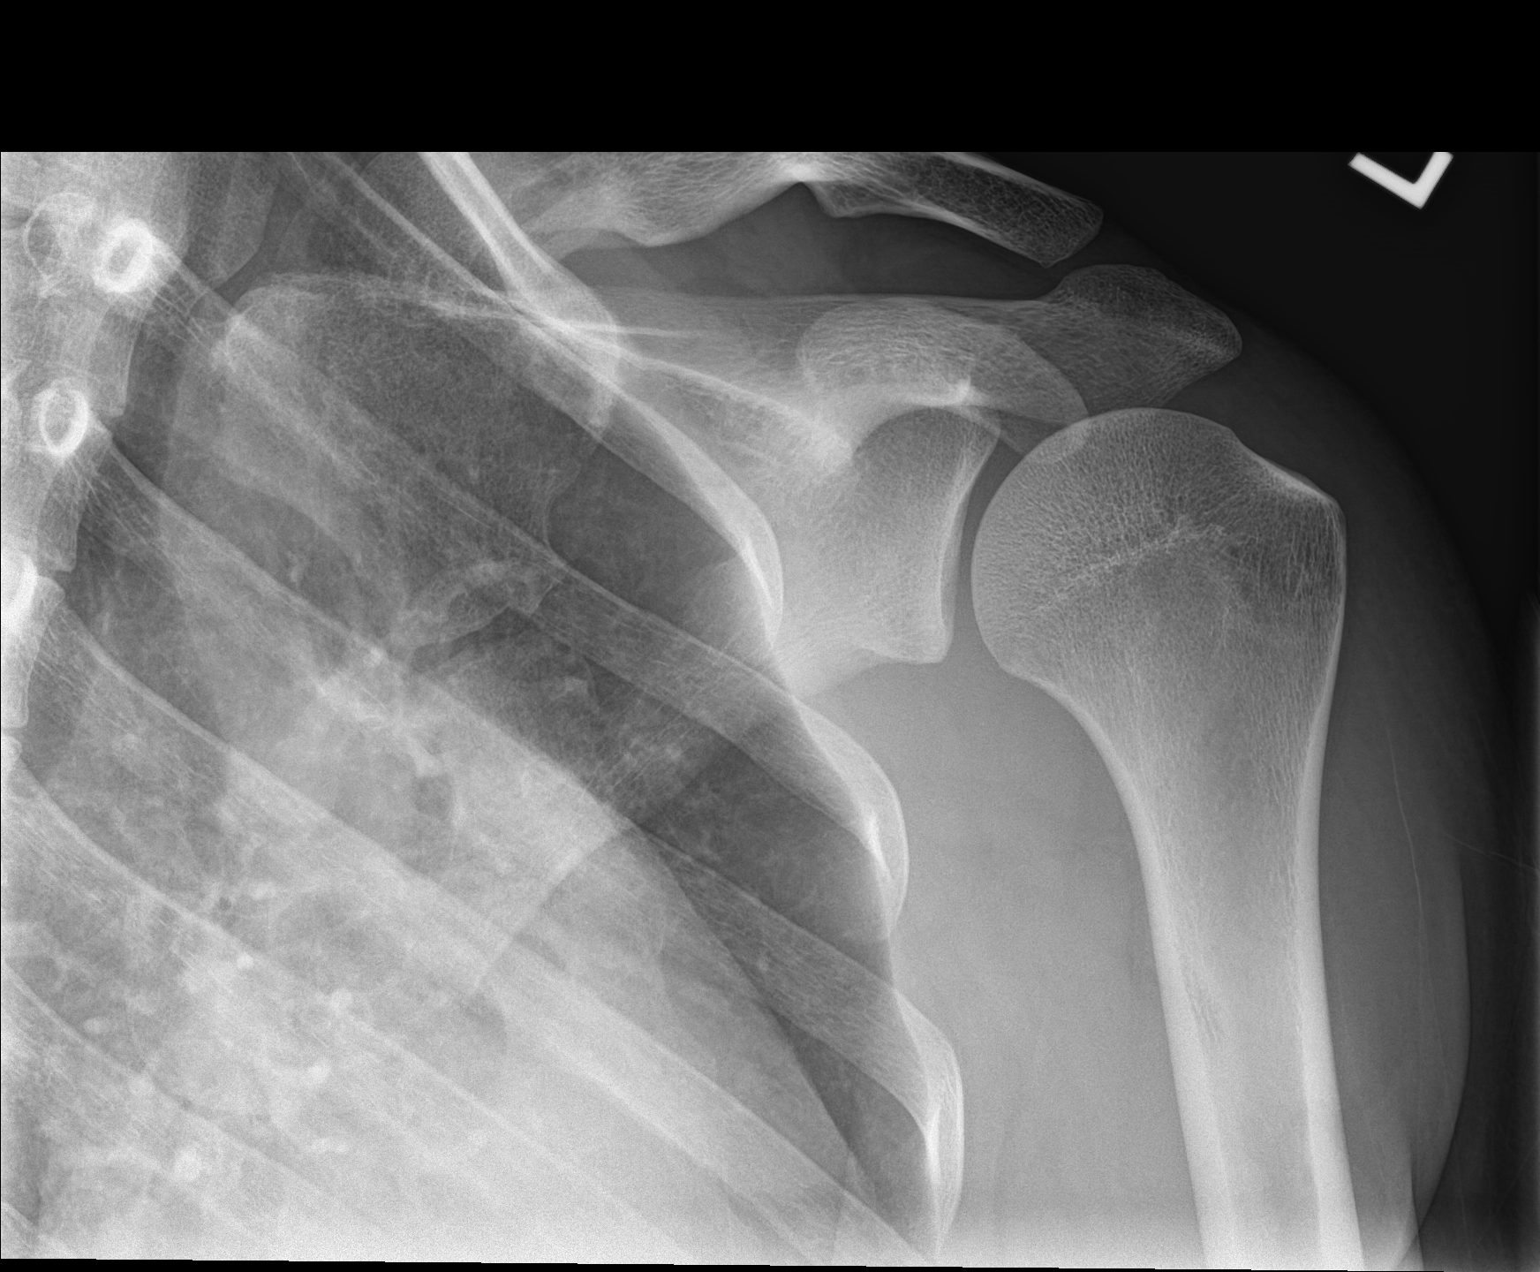

[shoulder y view]
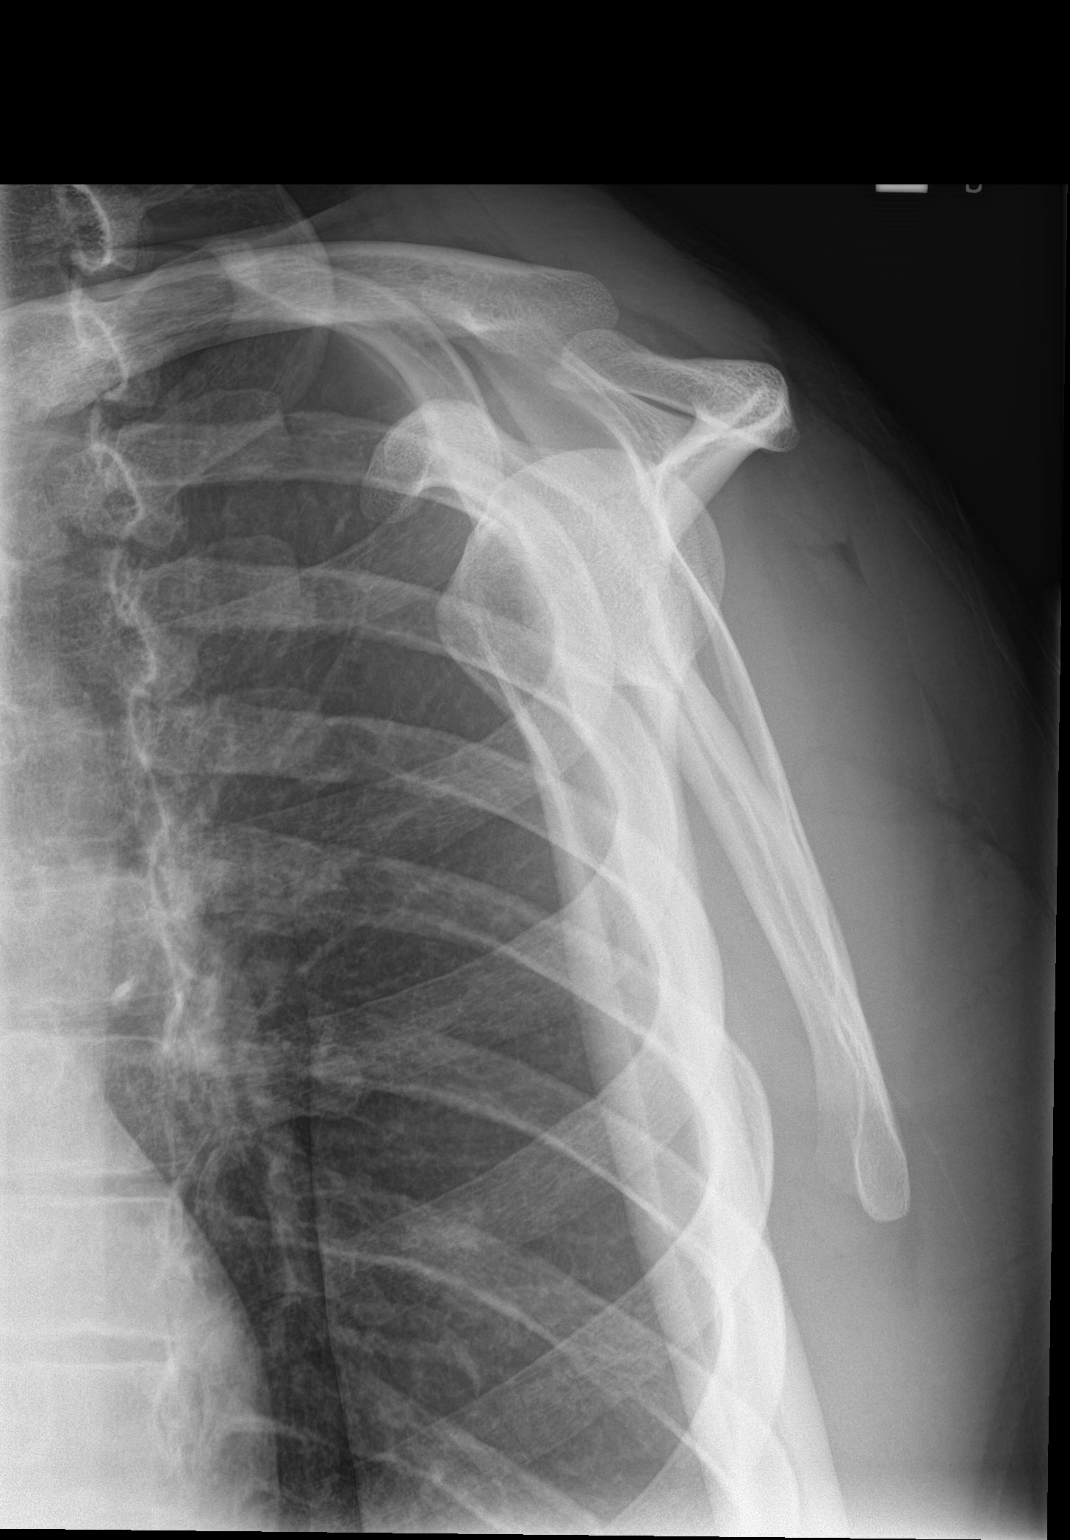

[shoulder axial]
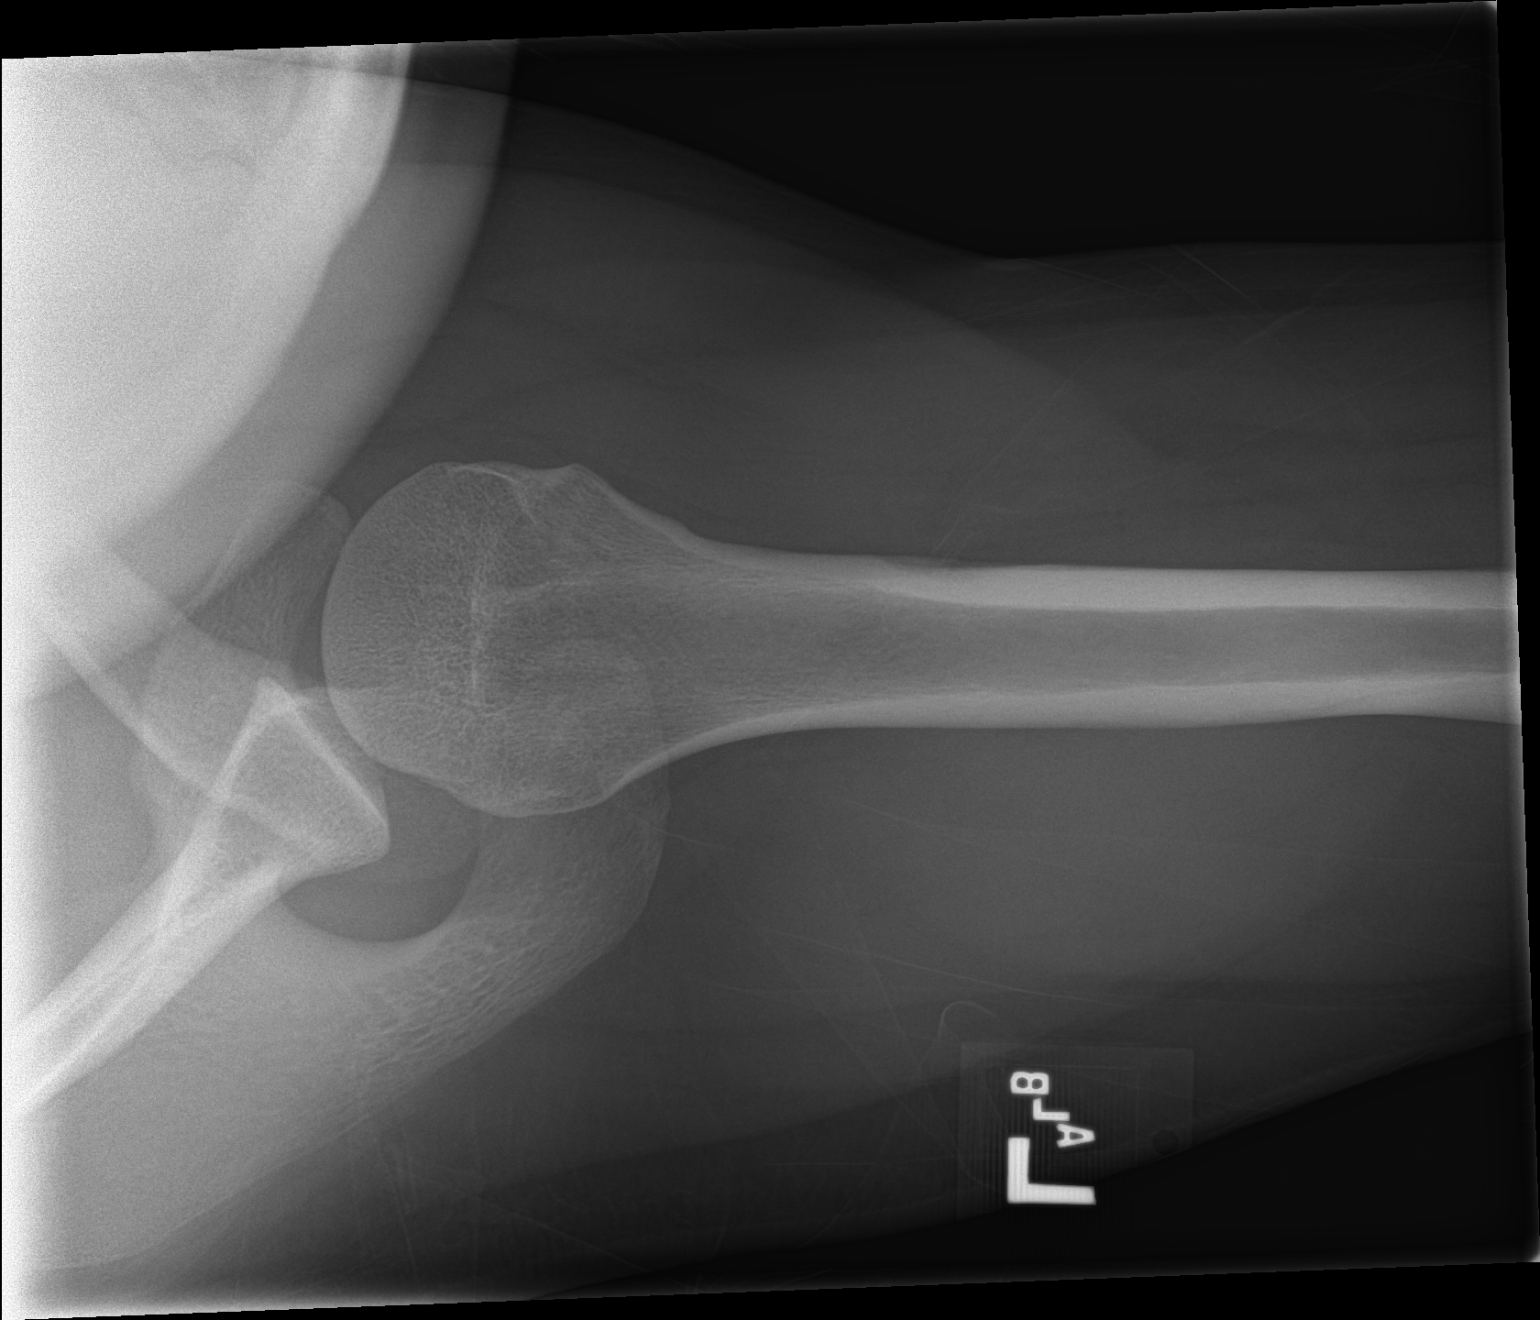

[3 of 3 positions shown; findings below may reference images not displayed]

FINDINGS: There is no evidence of fracture or dislocation. There is no
evidence of arthropathy or other focal bone abnormality. Soft
tissues are unremarkable.
IMPRESSION: Negative.

## 2017-11-30 ENCOUNTER — Ambulatory Visit
Admission: EM | Admit: 2017-11-30 | Discharge: 2017-11-30 | Disposition: A | Discharge status: Home / Self Care | Payer: Medicaid Other | Attending: Family Medicine | Admitting: Family Medicine

## 2017-11-30 ENCOUNTER — Other Ambulatory Visit: Payer: Self-pay

## 2017-11-30 DIAGNOSIS — Z79899 Other long term (current) drug therapy: Secondary | ICD-10-CM | POA: Insufficient documentation

## 2017-11-30 DIAGNOSIS — N410 Acute prostatitis: Secondary | ICD-10-CM | POA: Insufficient documentation

## 2017-11-30 DIAGNOSIS — N529 Male erectile dysfunction, unspecified: Secondary | ICD-10-CM | POA: Diagnosis not present

## 2017-11-30 DIAGNOSIS — M549 Dorsalgia, unspecified: Secondary | ICD-10-CM | POA: Diagnosis present

## 2017-11-30 DIAGNOSIS — F17210 Nicotine dependence, cigarettes, uncomplicated: Secondary | ICD-10-CM | POA: Insufficient documentation

## 2017-11-30 DIAGNOSIS — J45909 Unspecified asthma, uncomplicated: Secondary | ICD-10-CM | POA: Diagnosis not present

## 2017-11-30 LAB — URINALYSIS, COMPLETE (UACMP) WITH MICROSCOPIC
Bacteria, UA: NONE SEEN
Glucose, UA: NEGATIVE mg/dL
Hgb urine dipstick: NEGATIVE
Leukocytes, UA: NEGATIVE
Nitrite: NEGATIVE
Specific Gravity, Urine: 1.025 (ref 1.005–1.030)
Squamous Epithelial / LPF: NONE SEEN (ref 0–5)
WBC, UA: NONE SEEN WBC/hpf (ref 0–5)
pH: 6 (ref 5.0–8.0)

## 2017-11-30 MED ORDER — SULFAMETHOXAZOLE-TRIMETHOPRIM 800-160 MG PO TABS: 1.0000 | ORAL_TABLET | Freq: Two times a day (BID) | ORAL | 0 refills | Status: DC

## 2017-11-30 NOTE — ED Triage Notes (Addendum)
Patient states that he has noticed pressure in between his scrotum and anus. Patient states that he has seen primary doctor and referred to GI. Patient states that he has a colonoscopy scheduled for Wednesday. Patient states that he has not been eating properly and has not been taking bentyl or protonix given by GI. Patient is concerned about a possible prostatitis.   Patient also states that when he notices the pressure when having a bowel movement he notices a "white discharge" from his penis. Denies concern for STD.

## 2017-11-30 NOTE — ED Provider Notes (Signed)
[facility]    CSN: [id number] Arrival date & time: 11/30/17  1547     History   Chief Complaint Chief Complaint  Patient presents with  . Back Pain    HPI Frederick Griffith is a 22 y.o. male.   HPI  22 year old male presents accompanied by his wife complaining of pressure that he has noticed between his scrotum and anus.  Saw a primary doctor they referred him to a GI doctor who is performing a colonoscopy on Wednesday 2 days from today.  She  complains that when he has a bowel movement he has pressure and he notices a white discharge from his penis.  He explains this is being watery and whiter than normal ejaculation.  He has  Erectile dysfunction while on Strattera.  When he Does not take the medication he  experiences no difficulty with maintaining an erection.  No fever or chills.       Past Medical History:  Diagnosis Date  . Asthma     There are no active problems to display for this patient.   History reviewed. No pertinent surgical history.     Home Medications    Prior to Admission medications   Medication Sig Start Date End Date Taking? Authorizing Provider  albuterol (PROVENTIL HFA;VENTOLIN HFA) 108 (90 Base) MCG/ACT inhaler Inhale 4-6 puffs by mouth every 4 hours as needed for wheezing, cough, and/or shortness of breath 07/05/15  Yes Loleta RoseForbach, Cory, MD  albuterol (PROVENTIL) (2.5 MG/3ML) 0.083% nebulizer solution Take 3 mLs (2.5 mg total) by nebulization every 6 (six) hours as needed for wheezing or shortness of breath. 07/05/15  Yes Loleta RoseForbach, Cory, MD  atomoxetine (STRATTERA) 25 MG capsule Take 25 mg by mouth daily.   Yes [provider]  EPINEPHrine 0.3 mg/0.3 mL IJ SOAJ injection Inject 0.3 mLs (0.3 mg total) into the muscle once. prn 07/31/15  Yes Conty, Pamala Hurryrlando, MD  pantoprazole (PROTONIX) 40 MG tablet TK 1 T PO QD. TAKE 30 MINUTES BEFORE MEAL 10/05/17  Yes [provider]  sulfamethoxazole-trimethoprim (BACTRIM DS,SEPTRA DS)  800-160 MG tablet Take 1 tablet by mouth 2 (two) times daily. 11/30/17   Lutricia Feiloemer, William P, PA-C    Family History Family History  Problem Relation Age of Onset  . Kidney failure Father   . Cirrhosis Father   . Diabetes Father     Social History Social History   Tobacco Use  . Smoking status: Current Every Day Smoker    Packs/day: 0.15  . Smokeless tobacco: Never Used  Substance Use Topics  . Alcohol use: No  . Drug use: Never     Allergies   Codeine; Doxycycline; and Shellfish allergy   Review of Systems Review of Systems  Constitutional: Positive for activity change. Negative for chills, fatigue and fever.  Genitourinary: Positive for difficulty urinating and discharge.  All other systems reviewed and are negative.    Physical Exam Triage Vital Signs ED Triage Vitals  Enc Vitals Group     BP 11/30/17 1615 127/78     Pulse Rate 11/30/17 1615 81     Resp 11/30/17 1615 18     Temp 11/30/17 1615 99.1 F (37.3 C)     Temp Source 11/30/17 1615 Oral     SpO2 11/30/17 1615 100 %     Weight 11/30/17 1615 200 lb (90.7 kg)     Height 11/30/17 1615 5\' 6"  (1.676 m)     Head Circumference --  Peak Flow --      Pain Score 11/30/17 1613 8     Pain Loc --      Pain Edu? --      Excl. in GC? --    No data found.  Updated Vital Signs BP 127/78 (BP Location: Left Arm)   Pulse 81   Temp 99.1 F (37.3 C) (Oral)   Resp 18   Ht 5\' 6"  (1.676 m)   Wt 200 lb (90.7 kg)   SpO2 100%   BMI 32.28 kg/m   Visual Acuity Right Eye Distance:   Left Eye Distance:   Bilateral Distance:    Right Eye Near:   Left Eye Near:    Bilateral Near:     Physical Exam  Constitutional: He is oriented to person, place, and time. He appears well-developed and well-nourished. No distress.  HENT:  Head: Normocephalic.  Eyes: Pupils are equal, round, and reactive to light. Right eye exhibits no discharge. Left eye exhibits no discharge.  Neck: Normal range of motion.  Abdominal:  Soft. Bowel sounds are normal.  Genitourinary: Rectum normal and penis normal. No penile tenderness.  Genitourinary Comments: Rectal exam was performed showing mildly boggy prostate that is extremely tender on both lobes.  Musculoskeletal: Normal range of motion.  Neurological: He is alert and oriented to person, place, and time.  Skin: Skin is warm and dry. He is not diaphoretic.  Psychiatric: He has a normal mood and affect. His behavior is normal. Judgment and thought content normal.  Nursing note and vitals reviewed.    UC Treatments / Results  Labs (all labs ordered are listed, but only abnormal results are displayed) Labs Reviewed  URINALYSIS, COMPLETE (UACMP) WITH MICROSCOPIC - Abnormal; Notable for the following components:      Result Value   Bilirubin Urine SMALL (*)    Ketones, ur TRACE (*)    Protein, ur TRACE (*)    All other components within normal limits    EKG None  Radiology No results found.  Procedures Procedures (including critical care time)  Medications Ordered in UC Medications - No data to display  Initial Impression / Assessment and Plan / UC Course  I have reviewed the triage vital signs and the nursing notes.  Pertinent labs & imaging results that were available during my care of the patient were reviewed by me and considered in my medical decision making (see chart for details).   Patient refused STD testing  Plan: 1. Test/x-ray results and diagnosis reviewed with patient 2. rx as per orders; risks, benefits, potential side effects reviewed with patient 3. Recommend supportive treatment with drinking plenty of fluids.  If you are not improving follow-up with your primary care physician or urology.  Contact the gastroenterologist to notifying them that I have started you on this medication prior to your colonoscopy on Wednesday 4. F/u prn if symptoms worsen or don't improve  Final Clinical Impressions(s) / UC Diagnoses   Final diagnoses:    Acute prostatitis     Discharge Instructions     Follow up with your primary care physician in 1 week    ED Prescriptions    Medication Sig Dispense Auth. Provider   sulfamethoxazole-trimethoprim (BACTRIM DS,SEPTRA DS) 800-160 MG tablet Take 1 tablet by mouth 2 (two) times daily. 28 tablet Lutricia Feil, PA-C     Controlled Substance Prescriptions [place] Controlled Substance Registry consulted? Not Applicable   Lutricia Feil, PA-C 11/30/17 1749

## 2017-11-30 NOTE — Discharge Instructions (Addendum)
Follow up with your primary care physician in 1 week.

## 2019-11-11 ENCOUNTER — Encounter: Payer: Self-pay | Admitting: Emergency Medicine

## 2019-11-11 ENCOUNTER — Ambulatory Visit
Admission: EM | Admit: 2019-11-11 | Discharge: 2019-11-11 | Disposition: A | Discharge status: Home / Self Care | Payer: Medicaid Other | Attending: Family Medicine | Admitting: Family Medicine

## 2019-11-11 ENCOUNTER — Other Ambulatory Visit: Payer: Self-pay

## 2019-11-11 DIAGNOSIS — H5712 Ocular pain, left eye: Secondary | ICD-10-CM

## 2019-11-11 MED ORDER — AZELASTINE HCL 0.05 % OP SOLN: 1.0000 [drp] | Freq: Two times a day (BID) | OPHTHALMIC | 1 refills | Status: AC

## 2019-11-11 NOTE — Discharge Instructions (Signed)
Eye drop as prescribed.  If persists, see [facility].  Take care  Dr. Adriana Simas

## 2019-11-11 NOTE — ED Triage Notes (Signed)
Patient c/o redness, tenderness, and drainage from his left eye that started this morning.  Patient denies fevers.

## 2019-11-11 NOTE — ED Provider Notes (Signed)
[facility]    CSN: [id number] Arrival date & time: 11/11/19  1026  History   Chief Complaint Chief Complaint  Patient presents with  . Eye Problem    left   HPI   24 year old male presents with the above complaint.  Patient reports that he awoke this morning with redness and pain of his left eye.  He states that he had difficulty opening his eye.  Denies drainage.  Denies crusting.  Patient states that he is concerned that he is developing an eye infection.  Eye redness currently resolved.  Denies pain at this time.  He has had some photophobia.  No fever.  No respiratory symptoms.  No other complaints.  Past Medical History:  Diagnosis Date  . Asthma    Home Medications    Prior to Admission medications   Medication Sig Start Date End Date Taking? Authorizing Provider  pantoprazole (PROTONIX) 40 MG tablet TK 1 T PO QD. TAKE 30 MINUTES BEFORE MEAL 10/05/17  Yes [provider]  VYVANSE 30 MG capsule Take 30 mg by mouth at bedtime. 08/12/19  Yes [provider]  albuterol (PROVENTIL HFA;VENTOLIN HFA) 108 (90 Base) MCG/ACT inhaler Inhale 4-6 puffs by mouth every 4 hours as needed for wheezing, cough, and/or shortness of breath 07/05/15   Hinda Kehr, MD  albuterol (PROVENTIL) (2.5 MG/3ML) 0.083% nebulizer solution Take 3 mLs (2.5 mg total) by nebulization every 6 (six) hours as needed for wheezing or shortness of breath. 07/05/15   Hinda Kehr, MD  atomoxetine (STRATTERA) 25 MG capsule Take 25 mg by mouth daily.    [provider]  azelastine (OPTIVAR) 0.05 % ophthalmic solution Place 1 drop into the left eye 2 (two) times daily. 11/11/19   Coral Spikes, DO  EPINEPHrine 0.3 mg/0.3 mL IJ SOAJ injection Inject 0.3 mLs (0.3 mg total) into the muscle once. prn 07/31/15   Norval Gable, MD  sulfamethoxazole-trimethoprim (BACTRIM DS,SEPTRA DS) 800-160 MG tablet Take 1 tablet by mouth 2 (two) times daily. 11/30/17   Lorin Picket, PA-C    Family  History Family History  Problem Relation Age of Onset  . Kidney failure Father   . Cirrhosis Father   . Diabetes Father     Social History Social History   Tobacco Use  . Smoking status: Current Every Day Smoker    Packs/day: 0.15  . Smokeless tobacco: Never Used  Substance Use Topics  . Alcohol use: No  . Drug use: Never     Allergies   Codeine, Doxycycline, and Shellfish allergy   Review of Systems Review of Systems  Eyes: Positive for photophobia, pain and redness. Negative for discharge.   Physical Exam Triage Vital Signs ED Triage Vitals  Enc Vitals Group     BP 11/11/19 1038 135/80     Pulse Rate 11/11/19 1038 71     Resp 11/11/19 1038 16     Temp 11/11/19 1038 98 F (36.7 C)     Temp Source 11/11/19 1038 Oral     SpO2 11/11/19 1038 99 %     Weight 11/11/19 1033 220 lb (99.8 kg)     Height 11/11/19 1033 5\' 6"  (1.676 m)     Head Circumference --      Peak Flow --      Pain Score 11/11/19 1033 0     Pain Loc --      Pain Edu? --      Excl. in [place]? --  Updated Vital Signs BP 135/80 (BP Location: Left Arm)   Pulse 71   Temp 98 F (36.7 C) (Oral)   Resp 16   Ht 5\' 6"  (1.676 m)   Wt 99.8 kg   SpO2 99%   BMI 35.51 kg/m   Visual Acuity Right Eye Distance: 20/20 ucorrected Left Eye Distance: 20/25 uncorrected Bilateral Distance: 20/20 uncorrected  Right Eye Near:   Left Eye Near:    Bilateral Near:     Physical Exam Vitals and nursing note reviewed.  Constitutional:      General: He is not in acute distress.    Appearance: Normal appearance. He is not ill-appearing.  HENT:     Head: Normocephalic and atraumatic.  Eyes:     General:        Right eye: No discharge.        Left eye: No discharge.     Conjunctiva/sclera: Conjunctivae normal.     Pupils: Pupils are equal, round, and reactive to light.  Cardiovascular:     Rate and Rhythm: Normal rate and regular rhythm.     Heart sounds: No murmur.  Pulmonary:     Effort: Pulmonary  effort is normal.     Breath sounds: Normal breath sounds. No wheezing, rhonchi or rales.  Neurological:     Mental Status: He is alert.  Psychiatric:        Mood and Affect: Mood normal.        Behavior: Behavior normal.    UC Treatments / Results  Labs (all labs ordered are listed, but only abnormal results are displayed) Labs Reviewed - No data to display  EKG   Radiology No results found.  Procedures Procedures (including critical care time)  Medications Ordered in UC Medications - No data to display  Initial Impression / Assessment and Plan / UC Course  I have reviewed the triage vital signs and the nursing notes.  Pertinent labs & imaging results that were available during my care of the patient were reviewed by me and considered in my medical decision making (see chart for details).    24 year old male presents with left eye pain.  No evidence of infection.  I suspect that he is experiencing allergic symptoms.  Placing on azelastine.  Final Clinical Impressions(s) / UC Diagnoses   Final diagnoses:  Left eye pain     Discharge Instructions     Eye drop as prescribed.  If persists, see [facility].  Take care  Dr. 30    ED Prescriptions    Medication Sig Dispense Auth. Provider   azelastine (OPTIVAR) 0.05 % ophthalmic solution Place 1 drop into the left eye 2 (two) times daily. 6 mL Adriana Simas, DO     PDMP not reviewed this encounter.   Tommie Sams, Tommie Sams 11/11/19 1504

## 2021-12-16 ENCOUNTER — Other Ambulatory Visit: Payer: Self-pay

## 2021-12-16 ENCOUNTER — Encounter: Payer: Self-pay | Admitting: *Deleted

## 2021-12-16 ENCOUNTER — Ambulatory Visit
Admission: EM | Admit: 2021-12-16 | Discharge: 2021-12-16 | Disposition: A | Discharge status: Home / Self Care | Payer: Medicaid Other | Attending: Emergency Medicine | Admitting: Emergency Medicine

## 2021-12-16 DIAGNOSIS — J4521 Mild intermittent asthma with (acute) exacerbation: Secondary | ICD-10-CM | POA: Diagnosis present

## 2021-12-16 DIAGNOSIS — Z20822 Contact with and (suspected) exposure to covid-19: Secondary | ICD-10-CM | POA: Diagnosis present

## 2021-12-16 LAB — SARS CORONAVIRUS 2 BY RT PCR: SARS Coronavirus 2 by RT PCR: NEGATIVE

## 2021-12-16 MED ORDER — IBUPROFEN 600 MG PO TABS: 600.0000 mg | ORAL_TABLET | Freq: Four times a day (QID) | ORAL | 0 refills | Status: AC | PRN

## 2021-12-16 MED ORDER — AEROCHAMBER MV MISC: 1 refills | Status: AC

## 2021-12-16 NOTE — ED Triage Notes (Signed)
PT 's son tested  positive  for COVID today. Pt  needs be tested for covid.

## 2021-12-16 NOTE — Discharge Instructions (Signed)
2 puffs from your albuterol inhaler using your spacer every 4 hours for 2 days, then every 6 hours for 2 days, then as needed.  Finish your Keflex, even if you feel better.  May take 600 mg of ibuprofen combined 1000 mg of Tylenol together 3-4 times a day as needed for pain.

## 2021-12-16 NOTE — ED Provider Notes (Signed)
HPI  SUBJECTIVE:  Frederick Griffith is a 26 y.o. male who presents with ***    Past Medical History:  Diagnosis Date   Asthma     History reviewed. No pertinent surgical history.  Family History  Problem Relation Age of Onset   Kidney failure Father    Cirrhosis Father    Diabetes Father     Social History   Tobacco Use   Smoking status: Every Day    Packs/day: 0.15    Types: Cigarettes   Smokeless tobacco: Never  Vaping Use   Vaping Use: Never used  Substance Use Topics   Alcohol use: No   Drug use: Never    No current facility-administered medications for this encounter.  Current Outpatient Medications:    albuterol (PROVENTIL HFA;VENTOLIN HFA) 108 (90 Base) MCG/ACT inhaler, Inhale 4-6 puffs by mouth every 4 hours as needed for wheezing, cough, and/or shortness of breath, Disp: 1 Inhaler, Rfl: 1   albuterol (PROVENTIL) (2.5 MG/3ML) 0.083% nebulizer solution, Take 3 mLs (2.5 mg total) by nebulization every 6 (six) hours as needed for wheezing or shortness of breath., Disp: 75 mL, Rfl: 1   atomoxetine (STRATTERA) 25 MG capsule, Take 25 mg by mouth daily., Disp: , Rfl:    azelastine (OPTIVAR) 0.05 % ophthalmic solution, Place 1 drop into the left eye 2 (two) times daily., Disp: 6 mL, Rfl: 1   EPINEPHrine 0.3 mg/0.3 mL IJ SOAJ injection, Inject 0.3 mLs (0.3 mg total) into the muscle once. prn, Disp: 2 Device, Rfl: 0   pantoprazole (PROTONIX) 40 MG tablet, TK 1 T PO QD. TAKE 30 MINUTES BEFORE MEAL, Disp: , Rfl: 3   sulfamethoxazole-trimethoprim (BACTRIM DS,SEPTRA DS) 800-160 MG tablet, Take 1 tablet by mouth 2 (two) times daily., Disp: 28 tablet, Rfl: 0   VYVANSE 30 MG capsule, Take 30 mg by mouth at bedtime., Disp: , Rfl:   Allergies  Allergen Reactions   Codeine Other (See Comments)    Hallucinations   Doxycycline Hives   Shellfish Allergy Hives     ROS  As noted in HPI.   Physical Exam  BP 121/86   Pulse 100   Temp 99 F (37.2 C)   Resp 18   SpO2 99%   *** Constitutional: Well developed, well nourished, no acute distress Eyes:  EOMI, conjunctiva normal bilaterally HENT: Normocephalic, atraumatic,mucus membranes moist Respiratory: Normal inspiratory effort Cardiovascular: Normal rate GI: nondistended skin: No rash, skin intact Musculoskeletal: no deformities Neurologic: Alert & oriented x 3, no focal neuro deficits Psychiatric: Speech and behavior appropriate   ED Course   Medications - No data to display  Orders Placed This Encounter  Procedures   SARS Coronavirus 2 by RT PCR (hospital order, performed in [facility] hospital lab) *cepheid single result test* Anterior Nasal Swab    Standing Status:   Standing    Number of Occurrences:   1    Results for orders placed or performed during the hospital encounter of 12/16/21 (from the past 24 hour(s))  SARS Coronavirus 2 by RT PCR (hospital order, performed in [facility] hospital lab) *cepheid single result test* Anterior Nasal Swab     Status: None   Collection Time: 12/16/21  6:01 PM   Specimen: Anterior Nasal Swab  Result Value Ref Range   SARS Coronavirus 2 by RT PCR NEGATIVE NEGATIVE   No results found.  ED Clinical Impression  No diagnosis found.   ED Assessment/Plan  COVID-negative.  Suspect patient's symptoms are due  to the recent black widow bite.  I also suspect he is having a mild asthma exacerbation as he is a Curator, is working in the heat and we have had multiple days of poor air quality.  Will send home with a regular scheduled albuterol inhaler with a spacer for the next 4 days, then as needed.  No steroids because he is currently being treated for an infection.  Follow-up with PCP as needed.  Discussed MDM, treatment plan, and plan for follow-up with patient.. patient agrees with plan.   No orders of the defined types were placed in this encounter.     *This clinic note was created using Dragon dictation software. Therefore, there may be occasional  mistakes despite careful proofreading.  ?

## 2023-04-28 ENCOUNTER — Ambulatory Visit
Admission: EM | Admit: 2023-04-28 | Discharge: 2023-04-28 | Disposition: A | Discharge status: Home / Self Care | Payer: Medicaid Other | Attending: Emergency Medicine | Admitting: Emergency Medicine

## 2023-04-28 DIAGNOSIS — L299 Pruritus, unspecified: Secondary | ICD-10-CM | POA: Diagnosis not present

## 2023-04-28 DIAGNOSIS — L309 Dermatitis, unspecified: Secondary | ICD-10-CM

## 2023-04-28 DIAGNOSIS — L03114 Cellulitis of left upper limb: Secondary | ICD-10-CM

## 2023-04-28 MED ORDER — CEPHALEXIN 500 MG PO CAPS: 500.0000 mg | ORAL_CAPSULE | Freq: Four times a day (QID) | ORAL | 0 refills | Status: AC

## 2023-04-28 MED ORDER — HYDROXYZINE HCL 25 MG PO TABS: 25.0000 mg | ORAL_TABLET | Freq: Four times a day (QID) | ORAL | 0 refills | Status: AC

## 2023-04-28 NOTE — ED Triage Notes (Signed)
Patient states that he's had a skin irritation that was poison ivy that's  on both arms. X 3 months. Patient states that the irritation has gotten worse and hurts.

## 2023-04-28 NOTE — Discharge Instructions (Addendum)
Use Eucerin cream to keep skin moisturized, avoid scratching, avoid heat,hot water as it makes rashes worse, take meds as directed. May try otc hydrocortisone cream for itching as label directed.  Avoid excessive water /exposure , follow-up with dermatologist of your choice-call for appointment, will need skin scraping to have definitive diagnosis.

## 2023-04-28 NOTE — ED Provider Notes (Signed)
[facility]    CSN: [id number] Arrival date & time: 04/28/23  1818      History   Chief Complaint Chief Complaint  Patient presents with   Rash    HPI Frederick Griffith is a 27 y.o. male.   27 year old male patient, Frederick Griffith, presents to urgent care for evaluation of skin irritation on bilateral upper arms x 3 months.  Patient states irritation has gotten worse and hurts on his left arm.  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  Patient denies latex glove use and states that he washes hands after every exposure and uses a scrub brush to get the particulate matter off. Pt endorses itching,manual scratching,use of hot water to control symptoms.   The history is provided by the patient. No language interpreter was used.  Rash Associated symptoms: no fever     Past Medical History:  Diagnosis Date   Asthma     Patient Active Problem List   Diagnosis Date Noted   Dermatitis 04/28/2023   Cellulitis of left upper extremity 04/28/2023   Itching 04/28/2023    History reviewed. No pertinent surgical history.     Home Medications    Prior to Admission medications   Medication Sig Start Date End Date Taking? Authorizing Provider  cephALEXin (KEFLEX) 500 MG capsule Take 1 capsule (500 mg total) by mouth 4 (four) times daily for 7 days. 04/28/23 05/05/23 Yes Eddi Hymes, Para March, NP  hydrOXYzine (ATARAX) 25 MG tablet Take 1 tablet (25 mg total) by mouth every 6 (six) hours. 04/28/23  Yes Myrka Sylva, Para March, NP  VYVANSE 30 MG capsule Take 30 mg by mouth at bedtime. 08/12/19  Yes [provider]  albuterol (PROVENTIL HFA;VENTOLIN HFA) 108 (90 Base) MCG/ACT inhaler Inhale 4-6 puffs by mouth every 4 hours as needed for wheezing, cough, and/or shortness of breath 07/05/15   Loleta Rose, MD  albuterol (PROVENTIL) (2.5 MG/3ML) 0.083% nebulizer solution Take 3 mLs (2.5 mg total) by nebulization every 6  (six) hours as needed for wheezing or shortness of breath. 07/05/15   Loleta Rose, MD  atomoxetine (STRATTERA) 25 MG capsule Take 25 mg by mouth daily.    [provider]  azelastine (OPTIVAR) 0.05 % ophthalmic solution Place 1 drop into the left eye 2 (two) times daily. 11/11/19   Tommie Sams, DO  EPINEPHrine 0.3 mg/0.3 mL IJ SOAJ injection Inject 0.3 mLs (0.3 mg total) into the muscle once. prn 07/31/15   Payton Mccallum, MD  ibuprofen (ADVIL) 600 MG tablet Take 1 tablet (600 mg total) by mouth every 6 (six) hours as needed. 12/16/21   Domenick Gong, MD  pantoprazole (PROTONIX) 40 MG tablet TK 1 T PO QD. TAKE 30 MINUTES BEFORE MEAL 10/05/17   [provider]  Spacer/Aero-Holding Chambers (AEROCHAMBER MV) inhaler Use as instructed 12/16/21   Domenick Gong, MD    Family History Family History  Problem Relation Age of Onset   Kidney failure Father    Cirrhosis Father    Diabetes Father     Social History Social History   Tobacco Use   Smoking status: Every Day    Current packs/day: 0.15    Types: Cigarettes   Smokeless tobacco: Never  Vaping Use   Vaping status: Never Used  Substance Use Topics   Alcohol use: No   Drug use: Never     Allergies   Tramadol, Codeine, Doxycycline, and Shellfish  allergy   Review of Systems Review of Systems  Constitutional:  Negative for fever.  Skin:  Positive for color change and rash.  All other systems reviewed and are negative.    Physical Exam Triage Vital Signs ED Triage Vitals  Encounter Vitals Group     BP 04/28/23 1848 129/77     Systolic BP Percentile --      Diastolic BP Percentile --      Pulse Rate 04/28/23 1848 77     Resp 04/28/23 1848 18     Temp 04/28/23 1848 98.4 F (36.9 C)     Temp Source 04/28/23 1848 Oral     SpO2 04/28/23 1848 100 %     Weight --      Height --      Head Circumference --      Peak Flow --      Pain Score 04/28/23 1846 0     Pain Loc --      Pain Education --       Exclude from Growth Chart --    No data found.  Updated Vital Signs BP 129/77 (BP Location: Left Arm)   Pulse 77   Temp 98.4 F (36.9 C) (Oral)   Resp 18   SpO2 100%   Visual Acuity Right Eye Distance:   Left Eye Distance:   Bilateral Distance:    Right Eye Near:   Left Eye Near:    Bilateral Near:     Physical Exam Vitals and nursing note reviewed.  Skin:    General: Skin is warm.     Capillary Refill: Capillary refill takes less than 2 seconds.     Findings: Erythema and rash present. Rash is scaling and urticarial. Rash is not pustular or vesicular.     Comments: Bilateral upper forearms with erythematous rash L>R, no purulent drainage,no satellite lesion.  Neurological:     General: No focal deficit present.     Mental Status: He is alert and oriented to person, place, and time.     GCS: GCS eye subscore is 4. GCS verbal subscore is 5. GCS motor subscore is 6.     Cranial Nerves: No cranial nerve deficit.     Sensory: No sensory deficit.  Psychiatric:        Attention and Perception: Attention normal.        Mood and Affect: Mood normal.        Speech: Speech normal.        Behavior: Behavior normal. Behavior is cooperative.      UC Treatments / Results  Labs (all labs ordered are listed, but only abnormal results are displayed) Labs Reviewed - No data to display  EKG   Radiology No results found.  Procedures Procedures (including critical care time)  Medications Ordered in UC Medications - No data to display  Initial Impression / Assessment and Plan / UC Course  I have reviewed the triage vital signs and the nursing notes.  Pertinent labs & imaging results that were available during my care of the patient were reviewed by me and considered in my medical decision making (see chart for details).    Discussed exam findings and plan of care with patient, treating with Keflex, atarax , and over-the-counter hydrocortisone cream, referred to dermatology  ,strict go to ER precautions given.   Patient verbalized understanding to this provider.  Ddx: Cellulitis of left forearm, dermatitis, itching, allergies Final Clinical Impressions(s) / UC Diagnoses   Final  diagnoses:  Dermatitis  Cellulitis of left upper extremity  Itching     Discharge Instructions      Use Eucerin cream to keep skin moisturized, avoid scratching, avoid heat,hot water as it makes rashes worse, take meds as directed. May try otc hydrocortisone cream for itching as label directed.  Avoid excessive water /exposure , follow-up with dermatologist of your choice-call for appointment, will need skin scraping to have definitive diagnosis.     ED Prescriptions     Medication Sig Dispense Auth. Provider   hydrOXYzine (ATARAX) 25 MG tablet Take 1 tablet (25 mg total) by mouth every 6 (six) hours. 12 tablet Emmalina Espericueta, NP   cephALEXin (KEFLEX) 500 MG capsule Take 1 capsule (500 mg total) by mouth 4 (four) times daily for 7 days. 28 capsule Haygen Zebrowski, Para March, NP      PDMP not reviewed this encounter.   Clancy Gourd, NP 04/28/23 2037

## 2024-07-15 ENCOUNTER — Ambulatory Visit
Admission: EM | Admit: 2024-07-15 | Discharge: 2024-07-15 | Disposition: A | Discharge status: Home / Self Care | Attending: Emergency Medicine | Admitting: Emergency Medicine

## 2024-07-15 ENCOUNTER — Encounter: Payer: Self-pay | Admitting: Emergency Medicine

## 2024-07-15 DIAGNOSIS — Z720 Tobacco use: Secondary | ICD-10-CM

## 2024-07-15 DIAGNOSIS — K029 Dental caries, unspecified: Secondary | ICD-10-CM | POA: Diagnosis not present

## 2024-07-15 DIAGNOSIS — K047 Periapical abscess without sinus: Secondary | ICD-10-CM | POA: Diagnosis not present

## 2024-07-15 MED ORDER — AMOXICILLIN-POT CLAVULANATE 875-125 MG PO TABS: 1.0000 | ORAL_TABLET | Freq: Two times a day (BID) | ORAL | 0 refills | Status: AC

## 2024-07-15 NOTE — ED Triage Notes (Signed)
 Patient c/o left upper dental pain off and on for 2 months.  Patient states that his pain has gotten worse over the past week.  Patient states that he is now having left sided facial pain.  Patient states that he is scheduled to have his tooth removed tomorrow. Patient denies fevers.

## 2024-07-15 NOTE — Discharge Instructions (Addendum)
 We are treating you for a dental infection. Please follow up with your dental provider tomorrow as scheduled, take antibiotic as directed, may take over-the-counter Tylenol/ibuprofen  and Orajel as label directed for pain .Go to Er for new or worsening issues(unable to take fluids,unable to keep medication down, worsening pain/swelling). Avoid chewing gum, hard candy, crunching ice,vaping as it makes issues worse.

## 2024-07-15 NOTE — ED Provider Notes (Signed)
 " [facility]    CSN: [id number] Arrival date & time: 07/15/24  1700      History   Chief Complaint Chief Complaint  Patient presents with   Dental Pain    HPI Frederick Griffith is a 29 y.o. male.   29 year old male, Frederick Griffith, presents to urgent care for evaluation of intermittent left sided dental pain for 2 months, patient states the pain has gotten worse over the last week. Pt reports dental appt tomorrow in [place]. Pt endorses vaping.  The history is provided by the patient. No language interpreter was used.  Dental Pain Associated symptoms: no facial swelling and no fever     Past Medical History:  Diagnosis Date   Asthma     Patient Active Problem List   Diagnosis Date Noted   Pain due to dental caries 07/15/2024   Dental infection 07/15/2024   Vapes nicotine containing substance 07/15/2024   Dermatitis 04/28/2023   Cellulitis of left upper extremity 04/28/2023   Itching 04/28/2023    History reviewed. No pertinent surgical history.     Home Medications    Prior to Admission medications  Medication Sig Start Date End Date Taking? Authorizing Provider  amoxicillin -clavulanate (AUGMENTIN ) 875-125 MG tablet Take 1 tablet by mouth every 12 (twelve) hours. 07/15/24  Yes Ellora Varnum, Rilla, NP  albuterol  (PROVENTIL  HFA;VENTOLIN  HFA) 108 (90 Base) MCG/ACT inhaler Inhale 4-6 puffs by mouth every 4 hours as needed for wheezing, cough, and/or shortness of breath 07/05/15   Gordan Huxley, MD  albuterol  (PROVENTIL ) (2.5 MG/3ML) 0.083% nebulizer solution Take 3 mLs (2.5 mg total) by nebulization every 6 (six) hours as needed for wheezing or shortness of breath. 07/05/15   Gordan Huxley, MD  atomoxetine (STRATTERA) 25 MG capsule Take 25 mg by mouth daily.    [provider]  azelastine  (OPTIVAR ) 0.05 % ophthalmic solution Place 1 drop into the left eye 2 (two) times daily. 11/11/19   Cook, Jayce G, DO  EPINEPHrine  0.3 mg/0.3 mL IJ SOAJ injection Inject 0.3  mLs (0.3 mg total) into the muscle once. prn 07/31/15   Servando Hire, MD  hydrOXYzine  (ATARAX ) 25 MG tablet Take 1 tablet (25 mg total) by mouth every 6 (six) hours. 04/28/23   Kamdon Reisig, NP  ibuprofen  (ADVIL ) 600 MG tablet Take 1 tablet (600 mg total) by mouth every 6 (six) hours as needed. 12/16/21   Mortenson, Ashley, MD  pantoprazole (PROTONIX) 40 MG tablet TK 1 T PO QD. TAKE 30 MINUTES BEFORE MEAL 10/05/17   [provider]  Spacer/Aero-Holding Chambers (AEROCHAMBER MV) inhaler Use as instructed 12/16/21   Van Knee, MD  VYVANSE 30 MG capsule Take 30 mg by mouth at bedtime. 08/12/19   [provider]    Family History Family History  Problem Relation Age of Onset   Kidney failure Father    Cirrhosis Father    Diabetes Father     Social History Social History[1]   Allergies   Tramadol, Codeine, Doxycycline, and Shellfish allergy   Review of Systems Review of Systems  Constitutional:  Negative for fever.  HENT:  Positive for dental problem. Negative for facial swelling.   All other systems reviewed and are negative.    Physical Exam Triage Vital Signs ED Triage Vitals  Encounter Vitals Group     BP 07/15/24 1715 (!) 144/85     Girls Systolic BP Percentile --      Girls Diastolic BP Percentile --  Boys Systolic BP Percentile --      Boys Diastolic BP Percentile --      Pulse Rate 07/15/24 1715 68     Resp 07/15/24 1715 15     Temp 07/15/24 1715 97.8 F (36.6 C)     Temp Source 07/15/24 1715 Oral     SpO2 07/15/24 1715 98 %     Weight 07/15/24 1714 220 lb 0.3 oz (99.8 kg)     Height 07/15/24 1714 5' 6 (1.676 m)     Head Circumference --      Peak Flow --      Pain Score 07/15/24 1713 8     Pain Loc --      Pain Education --      Exclude from Growth Chart --    No data found.  Updated Vital Signs BP (!) 144/85 (BP Location: Right Arm)   Pulse 68   Temp 97.8 F (36.6 C) (Oral)   Resp 15   Ht 5' 6 (1.676 m)   Wt 220 lb  0.3 oz (99.8 kg)   SpO2 98%   BMI 35.51 kg/m   Visual Acuity Right Eye Distance:   Left Eye Distance:   Bilateral Distance:    Right Eye Near:   Left Eye Near:    Bilateral Near:     Physical Exam Vitals and nursing note reviewed.  Constitutional:      General: He is awake.     Appearance: He is well-developed and well-groomed.  HENT:     Head: Normocephalic.     Right Ear: Tympanic membrane normal.     Left Ear: Tympanic membrane normal.     Nose: Nose normal.     Mouth/Throat:     Lips: Pink.     Mouth: Mucous membranes are moist.     Dentition: Abnormal dentition. Dental tenderness, gingival swelling and dental caries present.      Comments: Left sided, area of dental caries pain marked Neurological:     General: No focal deficit present.     Mental Status: He is alert and oriented to person, place, and time.     GCS: GCS eye subscore is 4. GCS verbal subscore is 5. GCS motor subscore is 6.  Psychiatric:        Attention and Perception: Attention normal.        Mood and Affect: Mood normal.        Speech: Speech normal.        Behavior: Behavior normal. Behavior is cooperative.      UC Treatments / Results  Labs (all labs ordered are listed, but only abnormal results are displayed) Labs Reviewed - No data to display  EKG   Radiology No results found.  Procedures Procedures (including critical care time)  Medications Ordered in UC Medications - No data to display  Initial Impression / Assessment and Plan / UC Course  I have reviewed the triage vital signs and the nursing notes.  Pertinent labs & imaging results that were available during my care of the patient were reviewed by me and considered in my medical decision making (see chart for details).     Discussed exam findings and plan of care with pt: We are treating you for a dental infection. Please follow up with your dental provider tomorrow as scheduled, take antibiotic as directed, may take  over-the-counter Tylenol/ibuprofen  and Orajel as label directed for pain .Go to Er for new or worsening issues(unable to take  fluids,unable to keep medication down, worsening pain/swelling). Avoid chewing gum, hard candy, crunching ice,vaping as it makes issues worse.  Patient verbalized understanding to this provider  Ddx: Dental infection, dental pain due to caries Final Clinical Impressions(s) / UC Diagnoses   Final diagnoses:  Pain due to dental caries  Dental infection  Vapes nicotine containing substance     Discharge Instructions      We are treating you for a dental infection. Please follow up with your dental provider tomorrow as scheduled, take antibiotic as directed, may take over-the-counter Tylenol/ibuprofen  and Orajel as label directed for pain .Go to Er for new or worsening issues(unable to take fluids,unable to keep medication down, worsening pain/swelling). Avoid chewing gum, hard candy, crunching ice,vaping as it makes issues worse.     ED Prescriptions     Medication Sig Dispense Auth. Provider   amoxicillin -clavulanate (AUGMENTIN ) 875-125 MG tablet Take 1 tablet by mouth every 12 (twelve) hours. 14 tablet Brennan Karam, Rilla, NP      PDMP not reviewed this encounter.     [1]  Social History Tobacco Use   Smoking status: Every Day    Current packs/day: 0.15    Types: Cigarettes   Smokeless tobacco: Never  Vaping Use   Vaping status: Never Used  Substance Use Topics   Alcohol use: No   Drug use: Never     Merlyn Conley, Rilla, NP 07/15/24 1840  "
